# Patient Record
Sex: Female | Born: 1945 | Race: White | Hispanic: No
Health system: Southern US, Community
[De-identification: ages and names within clinical notes are randomized; demographics above are authoritative.]

---

## 2005-04-06 ENCOUNTER — Ambulatory Visit: Payer: Self-pay | Admitting: Family Medicine

## 2005-04-06 ENCOUNTER — Emergency Department: Payer: Self-pay | Admitting: Emergency Medicine

## 2005-04-24 ENCOUNTER — Ambulatory Visit: Payer: Self-pay

## 2005-05-10 ENCOUNTER — Ambulatory Visit: Payer: Self-pay | Admitting: Anesthesiology

## 2005-05-16 ENCOUNTER — Ambulatory Visit: Payer: Self-pay | Admitting: Anesthesiology

## 2005-05-30 ENCOUNTER — Ambulatory Visit: Payer: Self-pay | Admitting: Anesthesiology

## 2005-06-21 ENCOUNTER — Ambulatory Visit: Payer: Self-pay | Admitting: Anesthesiology

## 2005-07-13 ENCOUNTER — Ambulatory Visit: Payer: Self-pay | Admitting: Anesthesiology

## 2005-07-25 ENCOUNTER — Encounter: Payer: Self-pay | Admitting: Anesthesiology

## 2005-08-08 ENCOUNTER — Ambulatory Visit: Payer: Self-pay | Admitting: Anesthesiology

## 2005-08-15 ENCOUNTER — Encounter: Payer: Self-pay | Admitting: Anesthesiology

## 2005-08-29 ENCOUNTER — Inpatient Hospital Stay: Payer: Self-pay | Admitting: Internal Medicine

## 2005-09-05 ENCOUNTER — Ambulatory Visit: Payer: Self-pay | Admitting: Anesthesiology

## 2005-10-25 ENCOUNTER — Ambulatory Visit: Payer: Self-pay | Admitting: *Deleted

## 2005-11-02 ENCOUNTER — Ambulatory Visit: Payer: Self-pay | Admitting: Anesthesiology

## 2005-11-03 ENCOUNTER — Ambulatory Visit: Payer: Self-pay | Admitting: Specialist

## 2005-11-22 ENCOUNTER — Ambulatory Visit: Payer: Self-pay | Admitting: Internal Medicine

## 2005-11-24 ENCOUNTER — Ambulatory Visit: Payer: Self-pay | Admitting: Specialist

## 2005-12-21 ENCOUNTER — Inpatient Hospital Stay: Payer: Self-pay | Admitting: Unknown Physician Specialty

## 2006-01-23 ENCOUNTER — Ambulatory Visit: Payer: Self-pay | Admitting: Anesthesiology

## 2006-02-21 ENCOUNTER — Ambulatory Visit: Payer: Self-pay | Admitting: Anesthesiology

## 2006-02-27 ENCOUNTER — Encounter: Payer: Self-pay | Admitting: Unknown Physician Specialty

## 2006-03-17 ENCOUNTER — Encounter: Payer: Self-pay | Admitting: Unknown Physician Specialty

## 2006-03-22 ENCOUNTER — Ambulatory Visit: Payer: Self-pay | Admitting: Anesthesiology

## 2006-04-09 ENCOUNTER — Ambulatory Visit: Payer: Self-pay | Admitting: *Deleted

## 2006-04-13 ENCOUNTER — Ambulatory Visit: Payer: Self-pay | Admitting: *Deleted

## 2006-04-23 ENCOUNTER — Ambulatory Visit: Payer: Self-pay | Admitting: Anesthesiology

## 2006-05-15 ENCOUNTER — Other Ambulatory Visit: Payer: Self-pay

## 2006-05-16 ENCOUNTER — Inpatient Hospital Stay: Payer: Self-pay | Admitting: Internal Medicine

## 2006-05-21 ENCOUNTER — Ambulatory Visit: Payer: Self-pay | Admitting: Internal Medicine

## 2006-05-23 ENCOUNTER — Ambulatory Visit: Payer: Self-pay | Admitting: Anesthesiology

## 2006-06-20 ENCOUNTER — Ambulatory Visit: Payer: Self-pay | Admitting: Anesthesiology

## 2006-07-23 ENCOUNTER — Ambulatory Visit: Payer: Self-pay | Admitting: Anesthesiology

## 2006-08-20 ENCOUNTER — Ambulatory Visit: Payer: Self-pay | Admitting: Anesthesiology

## 2006-10-03 ENCOUNTER — Other Ambulatory Visit: Payer: Self-pay

## 2006-10-03 ENCOUNTER — Ambulatory Visit: Payer: Self-pay | Admitting: Unknown Physician Specialty

## 2006-10-11 ENCOUNTER — Inpatient Hospital Stay: Payer: Self-pay | Admitting: Unknown Physician Specialty

## 2006-10-13 ENCOUNTER — Other Ambulatory Visit: Payer: Self-pay

## 2007-02-18 ENCOUNTER — Ambulatory Visit: Payer: Self-pay | Admitting: Anesthesiology

## 2007-04-16 ENCOUNTER — Other Ambulatory Visit: Payer: Self-pay

## 2007-04-16 ENCOUNTER — Ambulatory Visit: Payer: Self-pay | Admitting: *Deleted

## 2007-04-16 ENCOUNTER — Ambulatory Visit: Payer: Self-pay | Admitting: Unknown Physician Specialty

## 2007-04-16 ENCOUNTER — Ambulatory Visit: Payer: Self-pay | Admitting: Cardiology

## 2007-04-24 ENCOUNTER — Inpatient Hospital Stay: Payer: Self-pay | Admitting: Unknown Physician Specialty

## 2007-05-17 ENCOUNTER — Encounter: Payer: Self-pay | Admitting: Unknown Physician Specialty

## 2007-05-19 ENCOUNTER — Encounter: Payer: Self-pay | Admitting: Unknown Physician Specialty

## 2007-06-10 ENCOUNTER — Other Ambulatory Visit: Payer: Self-pay

## 2007-06-11 ENCOUNTER — Inpatient Hospital Stay: Payer: Self-pay | Admitting: Internal Medicine

## 2007-06-16 ENCOUNTER — Encounter: Payer: Self-pay | Admitting: Unknown Physician Specialty

## 2007-07-18 ENCOUNTER — Ambulatory Visit: Payer: Self-pay | Admitting: Specialist

## 2008-01-15 ENCOUNTER — Ambulatory Visit: Payer: Self-pay | Admitting: Family Medicine

## 2008-01-27 ENCOUNTER — Ambulatory Visit: Payer: Self-pay | Admitting: Unknown Physician Specialty

## 2008-05-14 ENCOUNTER — Ambulatory Visit: Payer: Self-pay | Admitting: Family Medicine

## 2008-06-04 ENCOUNTER — Ambulatory Visit: Payer: Self-pay | Admitting: Gastroenterology

## 2009-06-23 ENCOUNTER — Ambulatory Visit: Payer: Self-pay | Admitting: Family Medicine

## 2009-08-16 ENCOUNTER — Ambulatory Visit: Payer: Self-pay | Admitting: Internal Medicine

## 2009-08-17 ENCOUNTER — Inpatient Hospital Stay: Payer: Self-pay | Admitting: Internal Medicine

## 2009-09-27 ENCOUNTER — Inpatient Hospital Stay: Payer: Self-pay | Admitting: Specialist

## 2009-11-07 ENCOUNTER — Inpatient Hospital Stay: Payer: Self-pay | Admitting: *Deleted

## 2009-11-19 ENCOUNTER — Observation Stay: Payer: Self-pay | Admitting: Specialist

## 2009-12-17 ENCOUNTER — Ambulatory Visit: Payer: Self-pay | Admitting: Family Medicine

## 2010-04-09 ENCOUNTER — Emergency Department: Payer: Self-pay | Admitting: Emergency Medicine

## 2010-05-19 ENCOUNTER — Emergency Department: Payer: Self-pay | Admitting: Emergency Medicine

## 2010-06-12 ENCOUNTER — Inpatient Hospital Stay: Payer: Self-pay | Admitting: *Deleted

## 2010-07-26 ENCOUNTER — Encounter: Payer: Self-pay | Admitting: Specialist

## 2010-09-13 ENCOUNTER — Ambulatory Visit: Payer: Self-pay | Admitting: Family Medicine

## 2010-10-31 ENCOUNTER — Inpatient Hospital Stay: Payer: Self-pay | Admitting: Internal Medicine

## 2010-12-04 ENCOUNTER — Inpatient Hospital Stay: Payer: Self-pay | Admitting: Internal Medicine

## 2011-03-10 ENCOUNTER — Inpatient Hospital Stay: Payer: Self-pay | Admitting: Internal Medicine

## 2011-03-17 ENCOUNTER — Ambulatory Visit: Payer: Self-pay | Admitting: Internal Medicine

## 2011-03-17 ENCOUNTER — Inpatient Hospital Stay: Payer: Self-pay | Admitting: Internal Medicine

## 2011-05-25 ENCOUNTER — Ambulatory Visit: Payer: Self-pay

## 2011-07-18 ENCOUNTER — Ambulatory Visit: Payer: Self-pay | Admitting: Gastroenterology

## 2011-09-14 ENCOUNTER — Ambulatory Visit: Payer: Self-pay | Admitting: Family Medicine

## 2011-10-12 ENCOUNTER — Other Ambulatory Visit: Payer: Self-pay | Admitting: Rehabilitation

## 2011-10-12 DIAGNOSIS — M549 Dorsalgia, unspecified: Secondary | ICD-10-CM

## 2011-10-20 ENCOUNTER — Ambulatory Visit
Admission: RE | Admit: 2011-10-20 | Discharge: 2011-10-20 | Disposition: A | Discharge status: Home / Self Care | Payer: Medicare Other | Source: Ambulatory Visit | Attending: Rehabilitation | Admitting: Rehabilitation

## 2011-10-20 VITALS — BP 116/59 | HR 80

## 2011-10-20 DIAGNOSIS — M549 Dorsalgia, unspecified: Secondary | ICD-10-CM

## 2011-10-20 MED ORDER — IOHEXOL 180 MG/ML  SOLN
15.0000 mL | Freq: Once | INTRAMUSCULAR | Status: AC | PRN
  Administered 2011-10-20: 15 mL via INTRATHECAL

## 2011-10-20 MED ORDER — DIAZEPAM 5 MG PO TABS
5.0000 mg | ORAL_TABLET | Freq: Once | ORAL | Status: AC
  Administered 2011-10-20: 5 mg via ORAL

## 2011-10-20 NOTE — Progress Notes (Signed)
States she has been off Sertraline for the past two days.  jkl

## 2012-04-23 ENCOUNTER — Observation Stay: Payer: Self-pay | Admitting: Family Medicine

## 2012-04-23 LAB — CBC
HCT: 43.5 % (ref 35.0–47.0)
MCH: 31.8 pg (ref 26.0–34.0)
MCHC: 33.2 g/dL (ref 32.0–36.0)
MCV: 96 fL (ref 80–100)
RBC: 4.55 10*6/uL (ref 3.80–5.20)
RDW: 15.2 % — ABNORMAL HIGH (ref 11.5–14.5)
WBC: 14 10*3/uL — ABNORMAL HIGH (ref 3.6–11.0)

## 2012-04-23 LAB — COMPREHENSIVE METABOLIC PANEL
Albumin: 3.7 g/dL (ref 3.4–5.0)
Alkaline Phosphatase: 121 U/L (ref 50–136)
Bilirubin,Total: 0.5 mg/dL (ref 0.2–1.0)
Calcium, Total: 8.8 mg/dL (ref 8.5–10.1)
Co2: 32 mmol/L (ref 21–32)
EGFR (African American): >60
Glucose: 135 mg/dL — ABNORMAL HIGH (ref 65–99)
SGPT (ALT): 20 U/L (ref 12–78)
Total Protein: 7.3 g/dL (ref 6.4–8.2)

## 2012-04-23 LAB — PRO B NATRIURETIC PEPTIDE: B-Type Natriuretic Peptide: 3465 pg/mL — ABNORMAL HIGH (ref 0–125)

## 2012-04-23 LAB — CK TOTAL AND CKMB (NOT AT ARMC)
CK, Total: 41 U/L (ref 21–215)
CK, Total: 48 U/L (ref 21–215)

## 2012-04-23 LAB — PROTIME-INR: Prothrombin Time: 13.6 secs (ref 11.5–14.7)

## 2012-04-23 LAB — APTT: Activated PTT: 29.7 secs (ref 23.6–35.9)

## 2012-04-24 LAB — CBC WITH DIFFERENTIAL/PLATELET
Basophil #: 0 10*3/uL (ref 0.0–0.1)
Eosinophil %: 0 %
HCT: 40.6 % (ref 35.0–47.0)
HGB: 14.6 g/dL (ref 12.0–16.0)
Lymphocyte %: 5.8 %
MCHC: 35.9 g/dL (ref 32.0–36.0)
Platelet: 184 10*3/uL (ref 150–440)
RBC: 4.3 10*6/uL (ref 3.80–5.20)
RDW: 14.9 % — ABNORMAL HIGH (ref 11.5–14.5)
WBC: 8.9 10*3/uL (ref 3.6–11.0)

## 2012-04-24 LAB — TROPONIN I: Troponin-I: <0.02 ng/mL

## 2012-04-24 LAB — BASIC METABOLIC PANEL
Anion Gap: 5 — ABNORMAL LOW (ref 7–16)
BUN: 17 mg/dL (ref 7–18)
Co2: 32 mmol/L (ref 21–32)
Creatinine: 0.58 mg/dL — ABNORMAL LOW (ref 0.60–1.30)
EGFR (African American): >60
Glucose: 125 mg/dL — ABNORMAL HIGH (ref 65–99)
Potassium: 4.3 mmol/L (ref 3.5–5.1)
Sodium: 140 mmol/L (ref 136–145)

## 2012-04-24 LAB — CK TOTAL AND CKMB (NOT AT ARMC)
CK, Total: 35 U/L (ref 21–215)
CK-MB: 0.9 ng/mL (ref 0.5–3.6)

## 2012-04-29 LAB — CULTURE, BLOOD (SINGLE)

## 2012-05-09 ENCOUNTER — Ambulatory Visit: Payer: Self-pay | Admitting: Family Medicine

## 2012-06-12 ENCOUNTER — Ambulatory Visit: Payer: Self-pay | Admitting: Specialist

## 2012-07-16 ENCOUNTER — Ambulatory Visit: Payer: Self-pay | Admitting: Internal Medicine

## 2012-07-21 LAB — CBC
HCT: 45.3 % (ref 35.0–47.0)
HGB: 14.8 g/dL (ref 12.0–16.0)
MCH: 31 pg (ref 26.0–34.0)
MCHC: 32.7 g/dL (ref 32.0–36.0)
MCV: 95 fL (ref 80–100)
RBC: 4.78 10*6/uL (ref 3.80–5.20)

## 2012-07-21 LAB — URINALYSIS, COMPLETE
Bacteria: NONE SEEN
Bilirubin,UR: NEGATIVE
Blood: NEGATIVE
Glucose,UR: NEGATIVE mg/dL (ref 0–75)
Leukocyte Esterase: NEGATIVE
Nitrite: NEGATIVE
Protein: 100
Squamous Epithelial: 1

## 2012-07-21 LAB — DRUG SCREEN, URINE
Amphetamines, Ur Screen: NEGATIVE (ref ?–1000)
Barbiturates, Ur Screen: NEGATIVE (ref ?–200)
Cannabinoid 50 Ng, Ur ~~LOC~~: NEGATIVE (ref ?–50)
Methadone, Ur Screen: NEGATIVE (ref ?–300)
Phencyclidine (PCP) Ur S: NEGATIVE (ref ?–25)
Tricyclic, Ur Screen: NEGATIVE (ref ?–1000)

## 2012-07-21 LAB — COMPREHENSIVE METABOLIC PANEL
Albumin: 3.7 g/dL (ref 3.4–5.0)
Anion Gap: 2 — ABNORMAL LOW (ref 7–16)
Calcium, Total: 8.7 mg/dL (ref 8.5–10.1)
Chloride: 104 mmol/L (ref 98–107)
Co2: 35 mmol/L — ABNORMAL HIGH (ref 21–32)
EGFR (African American): 60 — ABNORMAL LOW
EGFR (Non-African Amer.): 51 — ABNORMAL LOW
Osmolality: 287 (ref 275–301)
Potassium: 5.1 mmol/L (ref 3.5–5.1)
SGOT(AST): 18 U/L (ref 15–37)
SGPT (ALT): 19 U/L (ref 12–78)
Sodium: 141 mmol/L (ref 136–145)
Total Protein: 7.3 g/dL (ref 6.4–8.2)

## 2012-07-21 LAB — TROPONIN I: Troponin-I: <0.02 ng/mL

## 2012-07-22 ENCOUNTER — Inpatient Hospital Stay: Payer: Self-pay | Admitting: Internal Medicine

## 2012-07-22 LAB — URINALYSIS, COMPLETE
Blood: NEGATIVE
Glucose,UR: NEGATIVE mg/dL (ref 0–75)
Ketone: NEGATIVE
Ph: 5 (ref 4.5–8.0)
Protein: 30
RBC,UR: 6 /HPF (ref 0–5)
Specific Gravity: 1.043 (ref 1.003–1.030)
Squamous Epithelial: NONE SEEN
WBC UR: 1 /HPF (ref 0–5)

## 2012-07-23 LAB — TSH: Thyroid Stimulating Horm: 0.677 u[IU]/mL

## 2012-07-23 LAB — MAGNESIUM: Magnesium: 2 mg/dL

## 2012-07-24 LAB — BASIC METABOLIC PANEL
Anion Gap: 5 — ABNORMAL LOW (ref 7–16)
BUN: 39 mg/dL — ABNORMAL HIGH (ref 7–18)
Calcium, Total: 8.9 mg/dL (ref 8.5–10.1)
Co2: 31 mmol/L (ref 21–32)
Creatinine: 0.72 mg/dL (ref 0.60–1.30)
EGFR (African American): >60
Glucose: 151 mg/dL — ABNORMAL HIGH (ref 65–99)
Osmolality: 301 (ref 275–301)
Potassium: 4.5 mmol/L (ref 3.5–5.1)
Sodium: 145 mmol/L (ref 136–145)

## 2012-07-26 LAB — PLATELET COUNT: Platelet: 181 10*3/uL (ref 150–440)

## 2012-08-15 ENCOUNTER — Ambulatory Visit: Payer: Self-pay | Admitting: Internal Medicine

## 2012-08-21 ENCOUNTER — Ambulatory Visit: Payer: Self-pay | Admitting: Pain Medicine

## 2012-08-27 ENCOUNTER — Other Ambulatory Visit: Payer: Self-pay | Admitting: Pain Medicine

## 2012-08-27 LAB — BASIC METABOLIC PANEL
Anion Gap: 6 — ABNORMAL LOW (ref 7–16)
Chloride: 106 mmol/L (ref 98–107)
Co2: 28 mmol/L (ref 21–32)
Potassium: 3.9 mmol/L (ref 3.5–5.1)
Sodium: 140 mmol/L (ref 136–145)

## 2012-08-27 LAB — MAGNESIUM: Magnesium: 1.3 mg/dL — ABNORMAL LOW

## 2012-09-11 ENCOUNTER — Observation Stay: Payer: Self-pay | Admitting: Internal Medicine

## 2012-09-12 LAB — CREATININE, SERUM
Creatinine: 0.54 mg/dL — ABNORMAL LOW (ref 0.60–1.30)
EGFR (African American): >60

## 2012-10-31 ENCOUNTER — Ambulatory Visit: Payer: Self-pay | Admitting: Family Medicine

## 2012-12-17 ENCOUNTER — Ambulatory Visit: Payer: Self-pay | Admitting: Family Medicine

## 2013-02-05 ENCOUNTER — Ambulatory Visit: Payer: Self-pay | Admitting: Pain Medicine

## 2013-02-13 ENCOUNTER — Ambulatory Visit: Payer: Self-pay | Admitting: Pain Medicine

## 2013-03-05 ENCOUNTER — Ambulatory Visit: Payer: Self-pay | Admitting: Pain Medicine

## 2013-03-18 ENCOUNTER — Ambulatory Visit: Payer: Self-pay | Admitting: Pain Medicine

## 2013-04-02 ENCOUNTER — Ambulatory Visit: Payer: Self-pay | Admitting: Pain Medicine

## 2013-05-13 ENCOUNTER — Ambulatory Visit: Payer: Self-pay | Admitting: Pain Medicine

## 2013-05-26 ENCOUNTER — Ambulatory Visit: Payer: Self-pay | Admitting: Pain Medicine

## 2013-06-24 ENCOUNTER — Ambulatory Visit: Payer: Self-pay | Admitting: Pain Medicine

## 2013-07-16 ENCOUNTER — Ambulatory Visit: Payer: Self-pay | Admitting: Pain Medicine

## 2013-10-29 ENCOUNTER — Ambulatory Visit: Payer: Self-pay | Admitting: Pain Medicine

## 2013-11-05 ENCOUNTER — Ambulatory Visit: Payer: Self-pay | Admitting: Pain Medicine

## 2013-12-05 ENCOUNTER — Ambulatory Visit: Payer: Self-pay | Admitting: Internal Medicine

## 2013-12-10 ENCOUNTER — Ambulatory Visit: Payer: Self-pay | Admitting: Pain Medicine

## 2013-12-16 ENCOUNTER — Ambulatory Visit: Payer: Self-pay | Admitting: Internal Medicine

## 2014-01-07 ENCOUNTER — Inpatient Hospital Stay: Payer: Self-pay | Admitting: Internal Medicine

## 2014-01-07 LAB — CBC
HCT: 40 % (ref 35.0–47.0)
HGB: 12.8 g/dL (ref 12.0–16.0)
MCH: 30.5 pg (ref 26.0–34.0)
MCHC: 32 g/dL (ref 32.0–36.0)
MCV: 96 fL (ref 80–100)
Platelet: 189 10*3/uL (ref 150–440)
RBC: 4.19 10*6/uL (ref 3.80–5.20)
RDW: 18.7 % — ABNORMAL HIGH (ref 11.5–14.5)
WBC: 10.6 10*3/uL (ref 3.6–11.0)

## 2014-01-07 LAB — THEOPHYLLINE LEVEL: THEOPHYLLINE: 2.6 ug/mL — AB (ref 10.0–20.0)

## 2014-01-07 LAB — BASIC METABOLIC PANEL
Anion Gap: 4 — ABNORMAL LOW (ref 7–16)
BUN: 14 mg/dL (ref 7–18)
CHLORIDE: 100 mmol/L (ref 98–107)
CREATININE: 0.7 mg/dL (ref 0.60–1.30)
Calcium, Total: 9.1 mg/dL (ref 8.5–10.1)
Co2: 34 mmol/L — ABNORMAL HIGH (ref 21–32)
EGFR (African American): >60
EGFR (Non-African Amer.): >60
GLUCOSE: 154 mg/dL — AB (ref 65–99)
OSMOLALITY: 279 (ref 275–301)
Potassium: 3.5 mmol/L (ref 3.5–5.1)
SODIUM: 138 mmol/L (ref 136–145)

## 2014-01-07 LAB — TROPONIN I

## 2014-01-08 LAB — CBC WITH DIFFERENTIAL/PLATELET
BASOS ABS: 0 10*3/uL (ref 0.0–0.1)
Basophil %: 0.2 %
Eosinophil #: 0 10*3/uL (ref 0.0–0.7)
Eosinophil %: 0 %
HCT: 39.4 % (ref 35.0–47.0)
HGB: 13.1 g/dL (ref 12.0–16.0)
LYMPHS PCT: 3.3 %
Lymphocyte #: 0.2 10*3/uL — ABNORMAL LOW (ref 1.0–3.6)
MCH: 31.8 pg (ref 26.0–34.0)
MCHC: 33.3 g/dL (ref 32.0–36.0)
MCV: 96 fL (ref 80–100)
Monocyte #: 0.1 x10 3/mm — ABNORMAL LOW (ref 0.2–0.9)
Monocyte %: 1.8 %
Neutrophil #: 6.8 10*3/uL — ABNORMAL HIGH (ref 1.4–6.5)
Neutrophil %: 94.7 %
Platelet: 179 10*3/uL (ref 150–440)
RBC: 4.12 10*6/uL (ref 3.80–5.20)
RDW: 19 % — AB (ref 11.5–14.5)
WBC: 7.2 10*3/uL (ref 3.6–11.0)

## 2014-01-08 LAB — BASIC METABOLIC PANEL
ANION GAP: 4 — AB (ref 7–16)
BUN: 22 mg/dL — AB (ref 7–18)
CALCIUM: 8.5 mg/dL (ref 8.5–10.1)
CHLORIDE: 103 mmol/L (ref 98–107)
Co2: 33 mmol/L — ABNORMAL HIGH (ref 21–32)
Creatinine: 0.65 mg/dL (ref 0.60–1.30)
EGFR (African American): >60
EGFR (Non-African Amer.): >60
Glucose: 153 mg/dL — ABNORMAL HIGH (ref 65–99)
Osmolality: 286 (ref 275–301)
Potassium: 4 mmol/L (ref 3.5–5.1)
Sodium: 140 mmol/L (ref 136–145)

## 2014-01-10 LAB — BASIC METABOLIC PANEL
ANION GAP: 4 — AB (ref 7–16)
BUN: 31 mg/dL — ABNORMAL HIGH (ref 7–18)
CALCIUM: 9.2 mg/dL (ref 8.5–10.1)
Chloride: 99 mmol/L (ref 98–107)
Co2: 34 mmol/L — ABNORMAL HIGH (ref 21–32)
Creatinine: 0.78 mg/dL (ref 0.60–1.30)
EGFR (Non-African Amer.): >60
GLUCOSE: 115 mg/dL — AB (ref 65–99)
Osmolality: 281 (ref 275–301)
Potassium: 4.8 mmol/L (ref 3.5–5.1)
SODIUM: 137 mmol/L (ref 136–145)

## 2014-01-11 LAB — CBC WITH DIFFERENTIAL/PLATELET
Basophil #: 0 10*3/uL (ref 0.0–0.1)
Basophil %: 0.1 %
EOS ABS: 0 10*3/uL (ref 0.0–0.7)
Eosinophil %: 0 %
HCT: 39.9 % (ref 35.0–47.0)
HGB: 13.2 g/dL (ref 12.0–16.0)
LYMPHS PCT: 2.3 %
Lymphocyte #: 0.3 10*3/uL — ABNORMAL LOW (ref 1.0–3.6)
MCH: 31 pg (ref 26.0–34.0)
MCHC: 33.1 g/dL (ref 32.0–36.0)
MCV: 94 fL (ref 80–100)
MONOS PCT: 3.7 %
Monocyte #: 0.4 x10 3/mm (ref 0.2–0.9)
NEUTROS PCT: 93.9 %
Neutrophil #: 11.2 10*3/uL — ABNORMAL HIGH (ref 1.4–6.5)
Platelet: 228 10*3/uL (ref 150–440)
RBC: 4.27 10*6/uL (ref 3.80–5.20)
RDW: 18 % — AB (ref 11.5–14.5)
WBC: 12 10*3/uL — ABNORMAL HIGH (ref 3.6–11.0)

## 2014-01-12 LAB — EXPECTORATED SPUTUM ASSESSMENT W REFEX TO RESP CULTURE

## 2014-01-15 ENCOUNTER — Ambulatory Visit: Payer: Self-pay | Admitting: Internal Medicine

## 2014-02-06 ENCOUNTER — Encounter: Payer: Self-pay | Admitting: Surgery

## 2014-02-12 ENCOUNTER — Ambulatory Visit: Payer: Self-pay | Admitting: Orthopedic Surgery

## 2014-02-12 LAB — CBC WITH DIFFERENTIAL/PLATELET
Basophil #: 0.1 10*3/uL (ref 0.0–0.1)
Basophil %: 0.4 %
EOS ABS: 0 10*3/uL (ref 0.0–0.7)
Eosinophil %: 0 %
HCT: 39.9 % (ref 35.0–47.0)
HGB: 12.9 g/dL (ref 12.0–16.0)
LYMPHS PCT: 5.4 %
Lymphocyte #: 0.8 10*3/uL — ABNORMAL LOW (ref 1.0–3.6)
MCH: 31.2 pg (ref 26.0–34.0)
MCHC: 32.4 g/dL (ref 32.0–36.0)
MCV: 96 fL (ref 80–100)
MONOS PCT: 3.9 %
Monocyte #: 0.5 x10 3/mm (ref 0.2–0.9)
NEUTROS ABS: 12.8 10*3/uL — AB (ref 1.4–6.5)
Neutrophil %: 90.3 %
PLATELETS: 220 10*3/uL (ref 150–440)
RBC: 4.15 10*6/uL (ref 3.80–5.20)
RDW: 16.6 % — ABNORMAL HIGH (ref 11.5–14.5)
WBC: 14.2 10*3/uL — ABNORMAL HIGH (ref 3.6–11.0)

## 2014-02-12 LAB — BASIC METABOLIC PANEL
Anion Gap: 8 (ref 7–16)
BUN: 17 mg/dL (ref 7–18)
CALCIUM: 9.2 mg/dL (ref 8.5–10.1)
Chloride: 97 mmol/L — ABNORMAL LOW (ref 98–107)
Co2: 34 mmol/L — ABNORMAL HIGH (ref 21–32)
Creatinine: 0.71 mg/dL (ref 0.60–1.30)
EGFR (Non-African Amer.): >60
GLUCOSE: 150 mg/dL — AB (ref 65–99)
Osmolality: 282 (ref 275–301)
POTASSIUM: 4.8 mmol/L (ref 3.5–5.1)
Sodium: 139 mmol/L (ref 136–145)

## 2014-02-26 ENCOUNTER — Ambulatory Visit: Payer: Self-pay | Admitting: Orthopedic Surgery

## 2014-02-26 LAB — HEMOGLOBIN: HGB: 11.1 g/dL — ABNORMAL LOW (ref 12.0–16.0)

## 2014-03-02 LAB — WOUND CULTURE

## 2014-03-05 ENCOUNTER — Emergency Department: Payer: Self-pay | Admitting: Emergency Medicine

## 2014-03-05 LAB — COMPREHENSIVE METABOLIC PANEL
ALBUMIN: 3.3 g/dL — AB (ref 3.4–5.0)
ANION GAP: 8 (ref 7–16)
Alkaline Phosphatase: 86 U/L
BUN: 13 mg/dL (ref 7–18)
Bilirubin,Total: 0.5 mg/dL (ref 0.2–1.0)
CALCIUM: 8.5 mg/dL (ref 8.5–10.1)
CHLORIDE: 99 mmol/L (ref 98–107)
CREATININE: 0.86 mg/dL (ref 0.60–1.30)
Co2: 32 mmol/L (ref 21–32)
EGFR (African American): >60
EGFR (Non-African Amer.): >60
Glucose: 123 mg/dL — ABNORMAL HIGH (ref 65–99)
OSMOLALITY: 279 (ref 275–301)
Potassium: 3.4 mmol/L — ABNORMAL LOW (ref 3.5–5.1)
SGOT(AST): 16 U/L (ref 15–37)
SGPT (ALT): 21 U/L
Sodium: 139 mmol/L (ref 136–145)
TOTAL PROTEIN: 6.6 g/dL (ref 6.4–8.2)

## 2014-03-05 LAB — CBC
HCT: 31.1 % — AB (ref 35.0–47.0)
HGB: 10.1 g/dL — ABNORMAL LOW (ref 12.0–16.0)
MCH: 30.9 pg (ref 26.0–34.0)
MCHC: 32.6 g/dL (ref 32.0–36.0)
MCV: 95 fL (ref 80–100)
PLATELETS: 329 10*3/uL (ref 150–440)
RBC: 3.28 10*6/uL — ABNORMAL LOW (ref 3.80–5.20)
RDW: 15.8 % — ABNORMAL HIGH (ref 11.5–14.5)
WBC: 14 10*3/uL — AB (ref 3.6–11.0)

## 2014-03-05 LAB — THEOPHYLLINE LEVEL: Theophylline: 9.9 ug/mL — ABNORMAL LOW (ref 10.0–20.0)

## 2014-03-05 LAB — TROPONIN I

## 2014-03-16 ENCOUNTER — Inpatient Hospital Stay: Payer: Self-pay | Admitting: Internal Medicine

## 2014-03-16 LAB — URINALYSIS, COMPLETE
BLOOD: NEGATIVE
Bilirubin,UR: NEGATIVE
Glucose,UR: NEGATIVE mg/dL (ref 0–75)
Nitrite: POSITIVE
PROTEIN: NEGATIVE
Ph: 6 (ref 4.5–8.0)
RBC,UR: 2 /HPF (ref 0–5)
SPECIFIC GRAVITY: 1.018 (ref 1.003–1.030)
SQUAMOUS EPITHELIAL: NONE SEEN
WBC UR: 40 /HPF (ref 0–5)

## 2014-03-16 LAB — CBC
HCT: 34.8 % — ABNORMAL LOW (ref 35.0–47.0)
HGB: 11.2 g/dL — ABNORMAL LOW (ref 12.0–16.0)
MCH: 28.9 pg (ref 26.0–34.0)
MCHC: 32.3 g/dL (ref 32.0–36.0)
MCV: 89 fL (ref 80–100)
PLATELETS: 349 10*3/uL (ref 150–440)
RBC: 3.89 10*6/uL (ref 3.80–5.20)
RDW: 16.7 % — ABNORMAL HIGH (ref 11.5–14.5)
WBC: 12.2 10*3/uL — ABNORMAL HIGH (ref 3.6–11.0)

## 2014-03-16 LAB — BASIC METABOLIC PANEL
ANION GAP: 7 (ref 7–16)
BUN: 20 mg/dL — ABNORMAL HIGH (ref 7–18)
Calcium, Total: 8.7 mg/dL (ref 8.5–10.1)
Chloride: 92 mmol/L — ABNORMAL LOW (ref 98–107)
Co2: 35 mmol/L — ABNORMAL HIGH (ref 21–32)
Creatinine: 0.6 mg/dL (ref 0.60–1.30)
EGFR (African American): >60
EGFR (Non-African Amer.): >60
Glucose: 82 mg/dL (ref 65–99)
Osmolality: 270 (ref 275–301)
Potassium: 3.9 mmol/L (ref 3.5–5.1)
Sodium: 134 mmol/L — ABNORMAL LOW (ref 136–145)

## 2014-03-16 LAB — TROPONIN I

## 2014-03-17 ENCOUNTER — Ambulatory Visit: Payer: Self-pay | Admitting: Internal Medicine

## 2014-03-17 LAB — BASIC METABOLIC PANEL
Anion Gap: 9 (ref 7–16)
BUN: 25 mg/dL — AB (ref 7–18)
CALCIUM: 8.6 mg/dL (ref 8.5–10.1)
CO2: 34 mmol/L — AB (ref 21–32)
CREATININE: 0.79 mg/dL (ref 0.60–1.30)
Chloride: 94 mmol/L — ABNORMAL LOW (ref 98–107)
Glucose: 135 mg/dL — ABNORMAL HIGH (ref 65–99)
Osmolality: 280 (ref 275–301)
Potassium: 3.9 mmol/L (ref 3.5–5.1)
Sodium: 137 mmol/L (ref 136–145)

## 2014-03-17 LAB — CBC WITH DIFFERENTIAL/PLATELET
BASOS PCT: 0.1 %
Basophil #: 0 10*3/uL (ref 0.0–0.1)
EOS PCT: 0 %
Eosinophil #: 0 10*3/uL (ref 0.0–0.7)
HCT: 33.6 % — AB (ref 35.0–47.0)
HGB: 10.8 g/dL — ABNORMAL LOW (ref 12.0–16.0)
LYMPHS PCT: 4.6 %
Lymphocyte #: 0.3 10*3/uL — ABNORMAL LOW (ref 1.0–3.6)
MCH: 28.7 pg (ref 26.0–34.0)
MCHC: 32.2 g/dL (ref 32.0–36.0)
MCV: 89 fL (ref 80–100)
MONOS PCT: 2.2 %
Monocyte #: 0.1 x10 3/mm — ABNORMAL LOW (ref 0.2–0.9)
NEUTROS ABS: 5.7 10*3/uL (ref 1.4–6.5)
NEUTROS PCT: 93.1 %
PLATELETS: 351 10*3/uL (ref 150–440)
RBC: 3.78 10*6/uL — ABNORMAL LOW (ref 3.80–5.20)
RDW: 17 % — ABNORMAL HIGH (ref 11.5–14.5)
WBC: 6.1 10*3/uL (ref 3.6–11.0)

## 2014-03-18 LAB — URINALYSIS, COMPLETE
BLOOD: NEGATIVE
Bilirubin,UR: NEGATIVE
GLUCOSE, UR: NEGATIVE mg/dL (ref 0–75)
Hyaline Cast: 6
Ketone: NEGATIVE
NITRITE: POSITIVE
PH: 6 (ref 4.5–8.0)
PROTEIN: NEGATIVE
SPECIFIC GRAVITY: 1.013 (ref 1.003–1.030)
Squamous Epithelial: 1
WBC UR: 5 /HPF (ref 0–5)

## 2014-03-21 LAB — CULTURE, BLOOD (SINGLE)

## 2014-04-17 ENCOUNTER — Ambulatory Visit: Payer: Self-pay | Admitting: Internal Medicine

## 2014-06-16 ENCOUNTER — Ambulatory Visit
Admit: 2014-06-16 | Disposition: A | Discharge status: Home / Self Care | Payer: Self-pay | Attending: Internal Medicine | Admitting: Internal Medicine

## 2014-06-19 ENCOUNTER — Emergency Department: Payer: Self-pay | Admitting: Emergency Medicine

## 2014-07-17 ENCOUNTER — Ambulatory Visit
Admit: 2014-07-17 | Disposition: A | Discharge status: Home / Self Care | Payer: Self-pay | Attending: Internal Medicine | Admitting: Internal Medicine

## 2014-07-17 DEATH — deceased

## 2014-08-07 NOTE — H&P (Signed)
PATIENT NAME:  Sydney Guerrero, Sydney Guerrero MR#:  [id number] DATE OF BIRTH:  [birth year]  DATE OF ADMISSION:  07/22/2012  PRIMARY CARE PHYSICIAN: Dr. Rolm GalaHeidi Grandis.   REFERRING PHYSICIAN: Chiquita LothJade Sung.   CHIEF COMPLAINT: Altered mental status. The patient is encephalopathic.   HISTORY OF THE PRESENT ILLNESS: The patient is a 69 year old Caucasian female with history of chronic obstructive pulmonary disease and chronic respiratory failure home oxygen dependent and history of obstructive sleep apnea. EMS was called by the neighbor for finding the patient slumped over in the chair, and she was responding. Upon arrival of EMS, the patient received 2 doses of Narcan, after which the patient became awake and responsive. Right now, the patient is back again in deep sleep. The patient had recent spinal cord stimulator implanted, and she is on hydrocodone for pain control. The patient had a prior admission to this hospital with a similar episode of altered mental status associated with acute respiratory failure secondary to CO2 narcosis and retention. The patient is not providing me any history. She will wake up upon stimulation but only for a few seconds. Does not respond to my questions. She just stares in my eyes with halfway open eyes. Then, she will be back to sleep.   REVIEW OF SYSTEMS: A 10-point system review is attempted but could not get any meaningful response from the patient.   PAST MEDICAL HISTORY: Chronic respiratory failure home oxygen dependent, chronic obstructive pulmonary disease, obstructive sleep apnea, noncompliant with CPAP treatment, systemic hypertension, hyperlipidemia, type 2 diabetes mellitus, past history of atrial fibrillation, chronic back pain, status post spinal cord stimulator implant, gastroesophageal reflux disease and history of cardiac pacemaker insertion.   PAST SURGICAL HISTORY: Back surgery and recent history of spinal cord stimulator implant. History of hysterectomy, cholecystectomy  and hip replacement.   SOCIAL HABITS: Ex-chronic smoker according to the records. No history of alcohol abuse.   SOCIAL HISTORY: She lives at home by herself. I could not get more information from her.   FAMILY HISTORY: Unobtainable due to the patient's obtundation. Her old records mention there is a family history of cancer but no specification.   ADMISSION MEDICATIONS: Aspirin 325 mg a day, Ventolin inhaler p.r.n., zolpidem 5 mg a day, gabapentin 600 mg 3 times a day, metformin 1000 mg twice a day, diltiazem 300 mg once a day, theophylline 200 mg twice a day, lorazepam 0.5 mg q.6 hours p.r.n. for anxiety, sertraline 50 mg 2 tablets once a day, omeprazole 20 mg twice a day, metoprolol succinate 50 mg twice a day, atorvastatin 40 mg a day, cyclobenzaprine 5 mg once a day, prednisone 10 mg a day, furosemide 20 mg a day. She is on oxygen. The liters were not specified.    ALLERGIES: No known drug allergies.   PHYSICAL EXAMINATION:  VITAL SIGNS: Blood pressure 118/60. Respiratory rate 24. Respiratory rate now is down to 16. Pulse 70, temperature 99.2. O2 saturation 97% while she is on 65% of BiPAP with oxygen supplementation.  GENERAL APPEARANCE: This is an elderly female lying in bed on BiPAP, sleeping, in no acute distress.  HEAD AND NECK: No pallor. No icterus. No cyanosis. Ear examination: Could not assess hearing due to patient drowsiness and decreased responsiveness. No visible ulcers or discharge. Examination of the nose showed no discharge, no bleeding. Oropharyngeal examination limited exam due to patient being drowsy and not following commands. She has BiPAP face mask. The visible part of the mouth shows no ulcers, no exudate, no oral thrush. Eye  examination revealed normal eyelids. Normal conjunctivae. Pupils are constricted. Neck is supple. Trachea at midline. No masses.  HEART: Normal S1, S2. No S3, S4. No murmur. No gallop. No carotid bruits.  RESPIRATORY: Normal breathing pattern without  use of accessory muscles. No rales. No wheezing. The patient is receiving now BiPAP. Lungs are clear.   ABDOMEN: Soft without tenderness. No hepatosplenomegaly. No masses. No hernias.  SKIN: No ulcers. No subcutaneous nodules.  MUSCULOSKELETAL: No joint swelling. No clubbing.  NEUROLOGIC: Cranial nerves II through XII were intact. No focal motor deficit. The patient is very lethargic and sleepy but arousable just for a few seconds and then she will be back to sleep. She is moving her extremities.  PSYCHIATRIC: Unobtainable due to patient's severe drowsiness and sleepiness.   LABORATORY FINDINGS AND RADIOLOGIC DATA: Chest x-ray showed no acute cardiopulmonary abnormality; however, there was cardiomegaly. CTA of the chest showed no evidence of pulmonary embolism. Incidental finding of mild asymmetry or fullness of the right lobe of the thyroid. EKG showed electronic pacing rhythm at rate of 72 per minute. Glucose 136, BUN 22, creatinine 1.1, sodium 141, potassium 5.1. Anion gap was 2. Normal liver function tests and liver transaminases. Total CPK 18. Troponin less than 0.02. Urine drug screen was positive for opiates. CBC showed white count of 12,000, hemoglobin 14, hematocrit 45, platelet count 275. Urinalysis was unremarkable except for 21 red blood cells, 1 white blood cell. No bacteria. ABG showed a pH of 7.23, pCO2 was 74, pO2 was 47 and bicarbonate 31.   ASSESSMENT:  1. Altered mental status secondary to metabolic encephalopathy and CO2 narcosis.  2. Acute on chronic respiratory failure, probably precipitated by narcotic usage.  3. Chronic obstructive pulmonary disease, on oxygen.  4. Obstructive sleep apnea, noncompliant with CPAP treatment.  5. Diabetes mellitus, type 2.  6. Systemic hypertension.  7. Chronic back pain.  8. Status post spinal cord stimulator implant.  9. Status post cardiac pacemaker insertion.  10. Gastroesophageal reflux disease  11. Osteoarthritis.  12. Depression.    PLAN: The patient will be admitted to the intensive care unit. She is right now on BiPAP treatment. Will reduce the oxygen FiO2 from 65% down to minimize CO2 narcosis. Will use Narcan p.r.n. if needed. I will keep the patient n.p.o. since she is very drowsy, very sleepy and barely able to open her eyes just for a brief period of time and it is unsafe to give her medication or to feed her. Gentle IV hydration. DuoNebs q.4 hours as needed. Small dose of IV Solu-Medrol. For deep vein thrombosis prophylaxis, will place her on subcutaneous heparin 5000 units twice a day. Will monitor her response.   The patient's code status according to the old records is FULL CODE.  Critical time management spent was more than 50 minutes. Time spent in evaluating this patient took more than 1 hour.    ____________________________ Carney Corners. Rudene Re, MD amd:gb D: 07/22/2012 01:05:59 ET T: 07/22/2012 04:10:14 ET JOB#: [id number]  cc: Carney Corners. Rudene Re, MD, <Dictator> Zollie Scale MD ELECTRONICALLY SIGNED 07/31/2012 5:06

## 2014-08-07 NOTE — Discharge Summary (Signed)
PATIENT NAME:  Sydney Guerrero, [name] MR#:  [id number] DATE OF BIRTH:  1945/12/17  DATE OF ADMISSION:  09/11/2012 DATE OF DISCHARGE:  09/13/2012  PRIMARY CARE PROVIDER: Rolm GalaHeidi Grandis, MD  DISCHARGE DIAGNOSES: 1.  Left femur fracture.  2.  Chronic respiratory failure with chronic obstructive pulmonary disease, on 3 liters oxygen.   CONSULTANTS: Murlean HarkShalini Ramasunder, MD  IMAGING STUDIES: Done included CT of the head which showed left femur fracture.   ADMITTING HISTORY AND PHYSICAL AND HOSPITAL COURSE: Please see detailed H and P dictated by Dr. Rudene Rearwish on 09/11/2012. In brief, this is a 69 year old Caucasian female patient with history of COPD, on 3 liters oxygen at home, who presented to the hospital after having a mechanical fall, having left hip pain. The patient had a left femur intertrochanteric fracture, which was not thought to be amenable to surgery. The case was discussed with Dr. Claris Gladdenamasunder who says to discharge her home and follow up with her in the clinic, but the patient could not ambulate in the Emergency Room with significant pain, was admitted to the hospitalist service for placement in a rehab. The patient worked with PT, was seen by Dr. Claris Gladdenamasunder.  The patient is doing well, pain is well controlled at this time, continues to be on 3 liters oxygen and will be discharged to skilled nursing facility. She does have decreased range of motion on the left hip with tenderness on movement on exam.   DISCHARGE MEDICATIONS: Include: 1.  Aspirin 325 mg oral once a day.  2.  Ventolin HFA 2 puffs inhaled 2 times a day.  3.  Gabapentin 600 mg oral 2 times a day.  4.  Metformin 1000 mg oral once a day.  5.  Diltiazem 300 mg oral once a day. 6.  Theophylline 200 mg oral 2 times a day. 7.  Sertraline 50 mg 2 tablets oral once a day.  8.  Omeprazole 20 mg oral 2 times a day.  9.  Metoprolol succinate 50 mg oral 2 times a day.  10.  Atorvastatin 40 mg oral once a day at bedtime.  11.  Prednisone 10  mg oral once a day. 12.  Lasix 20 mg oral once a day.  13.  Nitrostat 0.4 mg sublingual as needed for chest pain.  14.  Brovana 15 mcg/mL 2 mL inhaled 2 times a day.  15.  Tramadol 50 mg oral as needed.  16.  Acetaminophen/hydrocodone 325/5 one tablet oral every 6 hours as needed for pain.  17.  Enoxaparin 40 mg subcutaneous once a day for 14 days.  18.  Lorazepam 1 mg oral once a day as needed.  19.  Ambien 5 mg oral once a day at bedtime.  20.  Magnesium oxide 400 mg oral once a day.   DISCHARGE INSTRUCTIONS: The patient will be on oxygen 3 liters through nasal cannula to keep sats between 90 to 92%. Low-sodium diet. Activity as tolerated using assistance with physical therapy. Follow up with Dr. Claris Gladdenamasunder of orthopedics in 1 to 2 weeks.   TIME SPENT: On day of discharge in discharge activity was 40 minutes. ____________________________ Molinda BailiffSrikar R. Britain Anagnos, MD srs:sb D: 09/13/2012 16:40:37 ET T: 09/13/2012 16:49:37 ET JOB#: [id number]  cc: Wardell HeathSrikar R. Elpidio AnisSudini, MD, <Dictator> Letitia CaulHeidi M. Grandis, MD Orie FishermanSRIKAR R Jazsmin Couse MD ELECTRONICALLY SIGNED 10/08/2012 10:42

## 2014-08-07 NOTE — Consult Note (Signed)
Brief Consult Note: Diagnosis: Left proximal femur periprosthetic fracture, non-displaced.   Consult note dictated.   Comments: Larey SeatFell while stepping backwards over elevated stone.  Went to stand up and fell again.  Came to ED and was found to have non-displaced fracture about total hip arthroplasty stem. Appears well secured without evidence of loosening on CT.  Wanted to go hom from ED but unable to use walker for TTWB.   PE: NVI. Bruising about thigh. Calf NT.  Preserved quad tone.  A/P: May be TTWB if able to maintain.  If not, NWB.  May require short stay in rehab/SNF.  Okay to do ROM of knee, ankle, hip - gentle.  Follow up in office in 2 weeks for repeat radiographs.  No intervention needed on this hospitalization.  Electronic Signatures: Murlean Harkamasunder, Kieara Schwark (MD)  (Signed 29-May-14 13:34)  Authored: Brief Consult Note   Last Updated: 29-May-14 13:34 by Murlean Harkamasunder, Dyllan Hughett (MD)

## 2014-08-07 NOTE — Consult Note (Signed)
   Comments   Pt's sister arrived. Chaplain present to complete HCPOA paperwork. Sister agrees with DNR and asks that a MOST form be completed as well. Will complete for patient to sign.  15 minutes  Electronic Signatures: Borders, Daryl EasternJoshua R (NP)  (Signed 11-Apr-14 14:38)  Authored: Palliative Care Phifer, Harriett SineNancy (MD)  (Signed 11-Apr-14 21:00)  Authored: Palliative Care   Last Updated: 11-Apr-14 21:00 by Phifer, Harriett SineNancy (MD)

## 2014-08-07 NOTE — H&P (Signed)
PATIENT NAME:  Sydney Guerrero, Sydney Guerrero MR#:  [id number] DATE OF BIRTH:  1945-05-20  DATE OF ADMISSION:  04/23/2012  PRIMARY CARE PHYSICIAN: Letitia Caul, MD   CHIEF COMPLAINT: Altered mental status and hypercapnia.   HISTORY OF PRESENT ILLNESS: The patient is a 69 year old female with a history of COPD, who had back surgery yesterday at [facility], who presented with shortness of breath and altered mental status, was found to be hypercapnic with a CO2 of 87. She was placed on BiPAP in the ER. She continues to remain on BiPAP machine with some confusion. She was treated with Levaquin and steroids as well as nebulizers. Hospitalist service was asked to evaluate the patient for admission. The patient is a poor historian at this time due to her altered mental status.   REVIEW OF SYSTEMS: Unable to obtain as the patient is altered and does not answer questions appropriately.   PAST MEDICAL HISTORY: According to the medical chart:  1. History of COPD, on  oxygen.  2. Hypertension.  3. Hyperlipidemia.  4. Chronic respiratory failure.  5. Diabetes.  6. GERD.  7. Chronic pain syndrome.   ALLERGIES: No known drug allergies.   SOCIAL HISTORY: Former smoker. No alcohol or drug use. She lives by herself.    FAMILY HISTORY: Per the medical chart, cancer.   MEDICATIONS:  1. Oxycodone/acetaminophen 10/325 q. 4 hours p.r.n. pain for 5 days. This prescription was given yesterday, 04/22/2012, after back surgery.  2. Levaquin 500 mg daily, start on 04/22/2012.  3. Lasix 20 mg daily.  4. Ambien 5 mg h.s. p.r.n.  5. Gabapentin 600 mg t.i.d.  6. Metformin 1000 mg daily.  7. Diltiazem 300 mg daily.  8. Lasix 20 mg daily.  9. Theophylline 200 mg q. 12 hours.  10. Ativan 1 mg q. 6 hours p.r.n.  11. Zoloft 50 mg, 2 tablets daily.  12. Omeprazole 20 mg b.i.d.  13. Metoprolol 50 mg b.i.d.   14. Atorvastatin 40 mg daily.  15. Cyclobenzaprine 5 mg h.s.   16. Prednisone 10 mg daily.   17. Ventolin HFA 90  mcg, 2 puffs by mouth b.i.d.   PAST SURGICAL HISTORY: 1. Back surgery yesterday. 2. Bilateral knee replacement.  3. Joint replacement.  4. Left hip replacement.  5. Hysterectomy.  6. Cholecystectomy.  7. Pacemaker.  PHYSICAL EXAMINATION: VITAL SIGNS: Temperature 99.4, pulse 70, respirations 24, blood pressure 158/81, 95% on BiPAP.  GENERAL: The patient is alert to her name and place, not time.  HEENT: Head is atraumatic. Pupils are round and reactive. Sclerae are anicteric. The patient has a BiPAP. I did not examine her oropharynx.  NECK: Short. Hard to appreciate any kind of enlarged thyroid or JVD.  LUNGS: She has got some diffuse wheezing without any crackles, rales.  CARDIOVASCULAR: Regular rate and rhythm. No murmurs, gallops, or rubs. PMI is difficult to assess due to body habitus.  ABDOMEN: Bowel sounds are positive. Nontender. Hard to appreciate organomegaly due to body habitus.  EXTREMITIES: No clubbing, cyanosis, or edema.  NEUROLOGIC: Hard to do a neurologic exam as the patient is not very cooperative. She does not have any obvious focal deficits.  MUSCULOSKELETAL: The patient does move all extremities.  SKIN: She has got some bruising on her lower extremities. No other rashes or lesions are found.   LABORATORY, DIAGNOSTIC AND RADIOLOGICAL DATA:  PCO2 of 82. BNP 3465. CK 41, CPK-MB 0.9. Troponin is less than 0.02. INR 1.0. Sodium 139, potassium 4.4, chloride 102, bicarbonate 32,  BUN 16, creatinine 0.74, glucose 135, calcium 8.8, bilirubin 0.5, alkaline phosphatase 121, ALT 20, AST 17, total protein 7.3, albumin 3.7. White blood cells 14, hemoglobin 14.5, hematocrit 44, platelets are 214.   Chest x-ray shows no acute cardiopulmonary disease. EKG shows a paced rhythm.   ASSESSMENT AND PLAN: A 69 year old female who had back surgery yesterday, was given narcotics, presents with shortness of breath, hypercapnia and wheezing with acute on chronic respiratory failure.   1. Acute  on chronic respiratory failure: Likely secondary to hypercapnia with COPD exacerbation. The patient is on a BiPAP. We will continue BiPAP for now. Repeat the ABG later this afternoon. Continue per respiratory therapy.  2. Acute chronic obstructive pulmonary disease exacerbation: We will continue with nebulizers, steroids, and Levaquin. Continue BiPAP for now as mentioned.  3. Hypercapnia: Likely secondary to COPD exacerbation as well narcotics. The patient has been receiving pain medications after her surgery. She may have taken too many, which probably contributed to some of her hypercapnia. We will monitor closely.  4. Diabetes: For now, the patient is n.p.o. due to her BiPAP. We will place her on sliding scale insulin, hold metformin. ADA diet when able to tolerate a p.o.   CODE STATUS: The patient is a FULL CODE, according to the past medical record.   TIME SPENT: Approximately 45 minutes.  ____________________________ Janyth ContesSital P. Juliene PinaMody, MD spm:cb D: 04/23/2012 14:23:19 ET T: 04/23/2012 15:12:06 ET JOB#: [id number] cc: Aribella Vavra P. Juliene PinaMody, MD, <Dictator> Letitia CaulHeidi M. Grandis, MD Janyth ContesSITAL P Laylah Riga MD ELECTRONICALLY SIGNED 04/23/2012 16:41

## 2014-08-07 NOTE — Consult Note (Signed)
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   Patient was seen by consultant.   Consult note dictated.   Comments: Psychiatry: Patient seen. Consult due to possible dementia. On exam tonight she is alert and interactive and pleasant. She has no complaints. In fact she is clearly trying to appear as good as possible. She said"I know everybody's name and I never misplace anything." On MMSI she gets an 18/30. Could be worse but she does have some dementia. I don't know how this compares to how she was before but I'm sure that sitting around hypoxic with your O2 in the 70s for a while will shave off some IQ points.   I'm not sure whether this is about capacity or treatment of dementia or what. I think currently she does have capacity based on our discussion about her treatment and homelife. I'd agree with prior assessments that she should be on no opiates or benzos at home if possible. Will follow. Let me know if I'm missing the point.  Electronic Signatures: Audery Amellapacs, Cesilia Shinn T (MD)  (Signed 09-Apr-14 20:45)  Authored: Brief Consult Note   Last Updated: 09-Apr-14 20:45 by Audery Amellapacs, Jailin Manocchio T (MD)

## 2014-08-07 NOTE — Discharge Summary (Signed)
PATIENT NAME:  Sydney Guerrero, Sydney Guerrero MR#:  [id number] DATE OF BIRTH:  [birth year]  DATE OF ADMISSION:  04/23/2012 DATE OF DISCHARGE:  04/24/2012  REASON FOR ADMISSION: Altered mental status and hypercapnia.   DISCHARGE DIAGNOSES: 1.  Altered mental status due to metabolic encephalopathy, CO2 narcosis.  2.  Nonadherence to CPAP.  3.  Acute on chronic respiratory failure.  4.  Chronic obstructive pulmonary disease exacerbation.  5.  Diabetes type 2, controlled.  6.  Previous history of atrial fibrillation.  7.  Osteoarthritis.  8.  Depression.  9.  Obstructive sleep apnea.  10.  Chronic back pain.  11.  Status post back surgery at [facility].  12.  Gastroesophageal reflux disease.  13.  Status post permanent pacemaker.   DISPOSITION: Home.   MEDICATIONS AT DISCHARGE:  Please discontinue Percocet.   MEDICATIONS GIVEN:   1.  Aspirin 325 mg daily.  2.  Ventolin 90 mcg inhaler 2 times a day 2 puffs.  3.  Levofloxacin 500 mg once daily for 10 days. This is a medication given at discharge at [facility] due to COPD exacerbation and her back surgery.  4.  Zolpidem 5 mg once daily.  5.  Gabapentin 600 mg 3 times a day.  6.  Metformin 1000 mg once daily.  7.  Diltiazem 300 mg once daily.  8.  Theophylline 200 mg every 12 hours.  9.  Lorazepam 1 mg take 0.5 tablets every 6 hours p.r.n. anxiety.  10.  Sertraline 50 mg, take 2 tablets once daily.  11.  Omeprazole 20 mg twice daily.  12.  Metoprolol succinate 50 mg twice daily.  13.  Atorvastatin 40 mg once daily.  14.  Cyclobenzaprine 5 mg once daily.  15.  Prednisone 10 mg once daily. This is chronic treatment.  16.  Furosemide 20 mg once daily.   HOSPITAL COURSE: The patient is a very nice 69 year old female who was admitted after having a procedure done at [facility] the previous day due to back pain. On the day of surgery, she was discharged and she was given some antibiotics for a COPD exacerbation at the moment. The patient came with  increased shortness of breath and altered mental status. She was found to be hypercapnic with a CO2 of 87% and she was placed on BiPAP. The patient actually improved significantly after BiPAP. There was a decision to continue her home steroids and Levaquin. She actually was given a higher dose of steroids due to a possible COPD exacerbation but at discharge she was sent home on the 10 mg of steroids that she takes on a regular basis. Her Levaquin was continued. Likely this exacerbation of her acute on chronic respiratory failure was due to hypercapnia which was triggered of the patient not using her BiPAP. The patient also was taking pain medications that might contribute to this.  We recommended the patient to stop her pain medications and to adhere to the BiPAP. She states that her mask did not fit for what we recommended to do a followup with her primary care physician for referral to a pulmonary doctor for a new sleep study and refitting of the mask.   PRIMARY CARE PHYSICIAN: Heidi Grandis.  TIME SPENT: I spent about 40 minutes with this discharge. The patient was evaluated by PT.  She did not have any significant needs other than maybe a new walker. She is discharged in good condition.      ____________________________ Felipa Furnace, MD rsg:cs D:  04/25/2012 16:03:00 ET T: 04/25/2012 21:06:15 ET JOB#: [id number]  cc: Felipa Furnaceoberto Sanchez Gutierrez, MD, <Dictator> Letitia CaulHeidi M. Grandis, MD Pearletha FurlOBERTO SANCHEZ GUTIERRE MD ELECTRONICALLY SIGNED 04/26/2012 13:46

## 2014-08-07 NOTE — H&P (Signed)
PATIENT NAME:  Sydney Guerrero, Sydney Guerrero MR#:  [id number] DATE OF BIRTH:  01-Mar-1946  DATE OF ADMISSION:  09/10/2012  PRIMARY CARE PHYSICIAN: Dr. Rolm Gala.   REFERRING PHYSICIAN: Governor Rooks, MD.  CHIEF COMPLAINT: Left groin pain, status post fall.   HISTORY OF PRESENT ILLNESS: Sydney Guerrero is a 69 year old Caucasian female with a history of chronic obstructive pulmonary disease, obstructive sleep apnea syndrome on chronic respiratory failure. The patient was in her usual state of health until last evening when she was walking. She tripped around a square brick, resulting in a fall and sustaining pain in the left groin area. The patient was transported to the Emergency Department for evaluation, and here she was found to have a left femur intertrochanteric fracture. The patient has hardware on the left hip area. The patient denies having any injury elsewhere, and she did not have any head trauma. The only pain she has is left groin area. She also denies having any focal weakness.   REVIEW OF SYSTEMS:  CONSTITUTIONAL: Denies having any fever. No chills. No fatigue.  EYES: No blurring of vision. No double vision.  ENT: No hearing impairment. No sore throat. No dysphagia.  CARDIOVASCULAR: No chest pain. No shortness of breath. No syncope.  RESPIRATORY: No cough. No sputum production. No hemoptysis.  GASTROINTESTINAL: No abdominal pain. No vomiting. No diarrhea.  GENITOURINARY: No dysuria. No frequency of urination.  MUSCULOSKELETAL: Apart from the left groin pain she has no other joint pain or swelling. No muscular pain or swelling.  INTEGUMENTARY: No skin rash. No ulcers.  NEUROLOGY: No focal weakness. No seizure activity. No headache.  PSYCHIATRY: No anxiety. No depression.  ENDOCRINE: No polyuria or polydipsia. No heat or cold intolerance.   PAST MEDICAL HISTORY: Chronic obstructive pulmonary disease, chronic respiratory failure, home oxygen-dependent on 3 liters, obstructive sleep apnea  syndrome; noncompliant with CPAP treatment; she does not use it. Systemic hypertension, hyperlipidemia, type 2 diabetes mellitus, history of atrial fibrillation, chronic back pain, status post spinal cord stimulator implant, and history of cardiac pacemaker insertion and history of gastroesophageal reflux disease.   PAST SURGICAL HISTORY: Hysterectomy, cholecystectomy, left hip replacement, spinal cord stimulator implant, and history of back surgery.   SOCIAL HABITS: Ex-chronic smoker. No alcohol or drug abuse.   FAMILY HISTORY: Her father died from cancer. She does not know the type. Her mother is still living. She suffers from diabetes mellitus and she is, right now, 69 years old.   ADMISSION MEDICATIONS: Ventilation inhaler 2 puffs 4 times a day p.r.n., tramadol 50 mg 4 times a day p.r.n., theophylline 200 mg twice a day, Zoloft 100 mg once a day, prednisone  10 mg a day, omeprazole 20 mg twice a day, Nitrostat 0.4 mg p.r.n., metoprolol succinate 50 mg twice a day, metformin 1000 mg once a day, gabapentin 600 mg 3 times a day, furosemide  20 mg a day, diltiazem 300 mg once a day, atorvastatin 40 mg once a day, aspirin 325 mg a day.   ALLERGIES: No known drug allergies.   PHYSICAL EXAMINATION: VITAL SIGNS: Blood pressure 126/66, respiratory rate 20, pulse 70, temperature 98.4, oxygen saturation 95% while patient is on oxygen.  GENERAL APPEARANCE: Elderly female laying in bed in no acute distress. She looks comfortable.  HEAD AND NECK EXAMINATION: No pallor. No icterus. No cyanosis. Ears, nose, throat: Ear examination revealed normal hearing. No discharge. No lesions. Examination of the nose showed no bleeding, no ulcers. Oropharyngeal examination revealed normal lips and tongue. No oral thrush,  no exudates. Eye examination revealed normal eyelids and conjunctivae. Pupils are about 4 to 5 mm, round, equal and reactive to light.  NECK: Supple. Trachea is midline. No thyromegaly. No cervical  lymphadenopathy. No masses.  HEART EXAM: Revealed distant heart sounds. No murmur was appreciated. Difficult to auscultate. No S3 or S4. No gallop. No carotid bruits.  RESPIRATORY EXAM: Showed normal breathing pattern without use of accessory muscles. No rales. There are a few mild scattered expiratory wheezes, heard occasionally. Good air entry.  ABDOMEN: Soft, without tenderness. No hepatosplenomegaly. No masses. No hernias.  SKIN EXAMINATION: Revealed no ulcers. No subcutaneous nodules.  MUSCULOSKELETAL: No joint swelling. No clubbing. She is limiting movement of the left leg due to pain in the groin area. However, she is able to lift the leg above the level bed level. NEUROLOGIC EXAM: Cranial nerves II-XII are intact. No focal motor deficit.  PSYCHIATRIC EVALUATION: The patient is alert and oriented x 3. Mood and affect were normal.   CT scan of the left hip showed a limited study due to presence of artifact from the left hip hardware. There was a nondisplaced intratrochanteric proximal left femur fracture without destruction.   Plain x-ray of the left hip showed no acute abnormality.   Blood workup was not done. The last time she had a blood workup was earlier this month on May 13th, showed a glucose of 147, BUN 11, creatinine 0.7, sodium 140, potassium 3.9.   ASSESSMENT: 1.  Left femur intertrochanteric fracture, status post fall.  2.  Chronic respiratory failure, home oxygen-dependent on 3 liters.  3.  Chronic obstructive pulmonary disease.  4.  Obstructive sleep apnea syndrome; noncompliant with CPAP.  5.  Systemic hypertension.  6.  Type 2 diabetes mellitus.  7.  Hyperlipidemia.  8.  History of atrial fibrillation.  9.  Chronic back pain, status post spinal cord stimulator implant.  10.  History of cardiac pacemaker insertion.  11.  History of gastroesophageal reflux disease.  12.  History of hysterectomy, cholecystectomy and left hip replacement.   PLAN: The patient will be  admitted for observation. Orthopedic consultation was obtained. They  feel that this is a nonsurgical approach and recommended non-weightbearing using either a walker or crutches. Physical therapy consult. Pain control, but to be cautious with the pain medications since this patient has a prior history of narcotic abuse resulting in respiratory decompensation and altered mental status. Continue home medications as listed above.   Code is status is FULL CODE.   Time spent in evaluating this patient took more than 1-1/2 hours due to the patient's non-cooperation. She has changed her mind several times and at one point she refused to be admitted. She wants to go home. However, she could not help herself to walk independently, then she changed her mind. I canceled the admission orders, then I readmitted the patient again. She also refuses to go to a placement and rehabilitation. Hopefully will involve the social services.      ____________________________ Carney CornersAmir M. Rudene Rearwish, MD amd:dm D: 09/11/2012 07:44:29 ET T: 09/11/2012 08:41:12 ET JOB#: [id number]  cc: Carney CornersAmir M. Rudene Rearwish, MD, <Dictator> Zollie ScaleAMIR M Tyrie Porzio MD ELECTRONICALLY SIGNED 09/12/2012 6:46

## 2014-08-07 NOTE — Consult Note (Signed)
PATIENT NAME:  Sydney Guerrero, [name] MR#:  [id number] DATE OF BIRTH:  Aug 05, 1945  DATE OF CONSULTATION:  09/13/2012  REFERRING PHYSICIAN:   CONSULTING PHYSICIAN:  Murlean HarkShalini Luise Yamamoto, MD  The patient is a 69 year old female who [facility]. She has had a total knee arthroplasty as well as a total hip arthroplasty on the left lower extremity by Dr. Gerrit Heckaliff. She had tripped backward at home while letting her dog out. She had fallen and attempted to stand and then fell again. She presented to the Emergency Room. She was found to have a nondisplaced periprosthetic fracture of the left total hip arthroplasty femoral stem. She was admitted for pain control and possible placement in a skilled nursing facility. The patient complains of pain in the thigh.   PHYSICAL EXAMINATION:  The left lower extremity was examined. The patient has obvious bruising about the thigh. She has moderate tenderness to palpation about the thigh. She has preserved quadriceps tone and is able to do quadriceps set. She has is neurovascularly intact distally. She has no groin pain on log roll.   IMAGING STUDIES:  Radiographs of the left hip as well as a CT scan of the left hip demonstrate a spiral periprosthetic fracture about the total hip arthroplasty femoral stem. The fractures completely nondisplaced. CT scan demonstrates that the femoral stem appears well fixed.   ASSESSMENT:  Periprosthetic fracture about left total hip arthroplasty with preservation of implant stability.   PLAN:  The patient will be toe-touch weightbearing. She will be allowed to work on gentle range of motion of the knee and hip. She will follow up in the office in 2 weeks with Dr. Kennedy BuckerMichael Menz. She is aware that we will begin weight bearing when she demonstrates radiographic signs of healing or near complete resolution of pain. I did discuss with her that all her prosthesis appears well fixed at this time, certainly with any displacement of the  fractures, her prosthesis can loosen. At this time she will require no surgical intervention.  ____________________________ Murlean HarkShalini Sajan Cheatwood, MD sr:jm D: 09/13/2012 16:31:41 ET T: 09/13/2012 17:38:40 ET JOB#: [id number]  cc: Murlean HarkShalini Quantrell Splitt, MD, <Dictator> Murlean HarkSHALINI Anny Sayler MD ELECTRONICALLY SIGNED 09/16/2012 14:17

## 2014-08-07 NOTE — Consult Note (Signed)
PATIENT NAME:  Sydney Guerrero, Sydney Guerrero MR#:  [id number] DATE OF BIRTH:  08-Aug-1945  DATE OF CONSULTATION:  07/24/2012  REFERRING PHYSICIAN:   CONSULTING PHYSICIAN:  Audery Amel, MD  IDENTIFYING INFORMATION AND REASON FOR CONSULTATION:  A 69 year old woman with a history of chronic obstructive pulmonary disease.  Consult for dementia, cognitive decline.   HISTORY OF PRESENT ILLNESS:  Information obtained from the patient and the chart.  The patient was brought in to the hospital this time having been found hypoxic and unresponsive at home.  She responded to Narcan briefly, but then became unresponsive again.  She had to be taken care of in the CCU briefly, but has now been able to move out to the floor.  On interview today I asked the patient what her explanation was for coming into the hospital and she says she thinks she must have overdosed on her pain medicine.  She did not intend to do this intentionally, however.  The patient denies any depression.  Denies hopelessness.  Denies any suicidal or homicidal ideation.  Says her mood feels fine.  She denies to me being aware of any recent change in her cognitive function.  I ask her if she ever notices having more difficulty remembering people's names or in finding things.  She absolutely denied it and said that she knows everyone's name and never loses anything.  She was clearly making an effort to appear as good as possible in discussion with me.  I think she understood that seeing a psychiatrist meant that there was a question about her cognitive ability and she assumed that this meant that someone was going to make her stop living independently.  The patient this morning had been described as being more slow and mentally down than might be expected.  I am not sure whether her current presentation when I am seeing her is subjectively different than her baseline or not.  I did a Mini-Mental status exam with her and she scored an 18.  Mild to moderately demented  most likely.   PAST PSYCHIATRIC HISTORY:  It does not appear that this patient has any significant past psychiatric history.  Denies any past history of depression.  No history of psychiatric hospitalization.  Does not seem to have been an issue despite her multiple medical problems.   PAST MEDICAL HISTORY:  Long history of multiple medical problems including COPD, history of GI bleed in the past, history of recent encephalopathy.  Type 2 diabetes, chronic back pain, gastroesophageal reflux.   MEDICATIONS ON ADMISSION:  Medication list when she came in were aspirin 325 mg a day, Ventolin inhaler as needed, Zolpidem 5 mg a day, gabapentin 600 mg 3 times a day, metformin 1000 mg twice a day, diltiazem 300 mg once a day, theophylline 200 mg twice a day, Ativan 0.5 mg q. 6 hours as needed, sertraline 50 mg 2 tablets once a day, omeprazole 20 mg twice a day, metoprolol 50 mg twice a day, atorvastatin 40 mg a day, cyclobenzaprine 5 mg once a day, prednisone 10 mg a day, furosemide 20 mg a day.  I can also state that having looked her up online she has been getting oxycodone 15 mg three of them a day.   MENTAL STATUS EXAMINATION:  Elderly woman, looks her stated age, neatly groomed, cooperative with the interview.  Good eye contact.  Psychomotor activity appeared to be normal.  Speech was normal in rate and tone.  Affect slightly flustered at times, but not  sad or depressed.  Appropriately reactive.  Mood stated as being fine.  Thoughts appeared to be superficially lucid, although she clearly has difficulty remembering details and loses her train of thought fairly easily.  No sign of delusional thinking.  Denies hallucinations.  Denied suicidal or homicidal ideation.  On Mini-Mental status exam she scores an 18 out of 30.  She clearly notices that she has had some mistakes on the Mini-Mental status and becomes more anxious after that.  I had to reassure her quite a bit.  Judgment and insight seemed fairly  reasonable.  She understood that over-usage of narcotic pain medicine was potentially deadly.  She understood that she had to be careful taking care of her medicine at home and keep them organized well.   SOCIAL HISTORY:  She tells me that she lives alone.  She has extensive family including a mother who is still living.  She says she gets frequent visitors at home.  The patient tells me she still drives.   ASSESSMENT:  The patient is a 69 year old woman who certainly currently shows signs of dementia.  I have no previous baseline to compare this to.  To superficial conversation she does not appear to be slowed or obviously impaired, although I think she does have memory impairment and probably executive impairment.  She does not appear to have an acute depression.  I do not think there is any question about suicidal behavior.  The patient has multiple reasons for developing dementia.  At this age she could be already having Alzheimer's disease, but in addition, she probably has elements of vascular dementia with her medical problems and having had more than one episode of becoming severely hypoxic including this episode in which her oxygen saturations were down into the 70s for who knows how long almost certainly would result in some brain injury.   TREATMENT PLAN:  I do not have any specific treatment to recommend.  Her primary care doctor can consider the possibility of adding Aricept or other medicines for Alzheimer's disease slowing.  I agree with the previous physician who said that this patient should not be on any narcotic pain medicines or any benzodiazepines at home.  I think she is probably too high a danger that she is going to continue to accidentally overdose on them with predictable results.  I think it would be a good idea for someone to talk to the family and ensure that they think that it is safe for her at home, perhaps have a home health nurse check in and make sure that she is managing her  medication correctly.   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   SECONDARY DIAGNOSES: AXIS I:  No further.  AXIS II:  No diagnosis.  AXIS III:  Multiple medical problems as detailed above.  AXIS IV:  Moderate stress from living alone.  AXIS V:  Functioning at time of evaluation 40.     ____________________________ Audery AmelJohn T. Jaydin Boniface, MD jtc:ea D: 07/24/2012 23:39:43 ET T: 07/25/2012 06:21:19 ET JOB#: [id number]  cc: Audery AmelJohn T. Bexton Haak, MD, <Dictator> Audery AmelJOHN T Mervyn Pflaum MD ELECTRONICALLY SIGNED 07/28/2012 11:27

## 2014-08-07 NOTE — Discharge Summary (Signed)
PATIENT NAME:  Sydney Guerrero, Sydney Guerrero MR#:  [id number] DATE OF BIRTH:  06/03/1945  DATE OF ADMISSION:  07/22/2012 DATE OF DISCHARGE:  07/26/2012  PRESENTING COMPLAINT: Altered mental status, lethargic.   DISCHARGE DIAGNOSES: 1.  Acute on chronic hypoxic hypercarbic respiratory failure in the setting of pain medication and benzodiazepine use.  2.  Chronic respiratory failure, on home oxygen.  3.  Endstage chronic obstructive pulmonary disease.  4.  Hypertension.  5.  Type 2 diabetes.   CONDITION ON DISCHARGE: Fair.   CODE STATUS: NO CODE, DNR.   MEDICATIONS:  1.  Aspirin 325 mg p.o. daily.  2.  Ventolin HFA 90 mcg inhalation two puffs b.i.d.  3.  Gabapentin 600 mg 3 times a day.  4.  Metformin 1000 mg daily.  5.  Diltiazem 300 mg extended release daily.  6.  Theophylline 200 mg orally every 12 hours.  7.  Sertraline 50 mg two tablets daily.  8.  Omeprazole 20 mg b.i.d.  9.  Metoprolol 50 mg extended release b.i.d.  10. Atorvastatin 40 mg at bedtime.  11. Prednisone 10 mg daily.  12. Lasix 20 mg daily.  13. Nitroglycerin 0.4 mg sublingual as needed.  14. Cefuroxime 500 mg one tablet b.i.d.  15. Azithromycin 250 mg p.o. daily.   ACTIVITY: Home health physical therapy, along with RN has been set up.  HOSPITAL COURSE:  1.  The patient is a 69 year old Caucasian female with history of endstage COPD, on home oxygen, and chronic back pain, who has been prescribed narcotics through a pain clinic and benzodiazepine for a muscle relaxant, who comes in with altered mental status, which was suspected due to metabolic encephalopathy and CO2 narcosis in the setting of overuse of pain meds and benzodiazepines. She presented with altered mental status, found to have CO2 of 74, went up to 96, increased up to 108. She has had similar presentation in the past. She was placed in the Intensive Care Unit, started on BiPAP. Dr. Meredeth Guerrero saw the patient, slowly weaned off her BiPAP and put her on 3 liters nasal  cannula oxygen. Her CO2 came back to normal. Her mentation improved. She has been tapered with steroids. She was continued on her inhalers and nebs. She will follow up with Dr. Meredeth Guerrero as an outpatient.  2.  Pneumonia. P.o. Zithromax and cefuroxime were prescribed.  3.  COPD, on home oxygen. P.o. steroids, inhalers and nebulizers were continued.  4.  Obstructive sleep apnea. The patient has been somewhat noncompliant with her CPAP; however, she was using while she was in-house.  5.  Type 2 diabetes. Resumed her home meds prior to discharge.  6.  Systemic hypertension. Home meds again were resumed, which was diltiazem and metoprolol.  7.  Chronic back pain. The patient has a spinal cord stimulator. She was recommended not to take narcotics, and she did agree to it.  8.  Physical therapy saw the patient and recommended home PT.   PLAN: Discharge plan was discussed with the patient's sister, Sydney Guerrero. The patient is a NO CODE. This was discussed with palliative care and the patient's sister, who was present during palliative care meeting. She will be followed up by health nurses as well. Hospital stay otherwise remained stable. The patient remained NO CODE, DNR.   TIME SPENT: Forty minutes.   ____________________________ Sydney HailSona A. Allena KatzPatel, MD sap:lg D: 07/26/2012 17:18:18 ET T: 07/27/2012 13:11:19 ET JOB#: [id number]  cc: Sydney Brett A. Allena KatzPatel, MD, <Dictator> Sydney E. Sydney IdeFleming, MD Sydney Lotter A Tryone Kille  MD ELECTRONICALLY SIGNED 08/05/2012 7:01

## 2014-08-08 NOTE — H&P (Signed)
PATIENT NAME:  Sydney Guerrero, Sydney Guerrero MR#:  [id number] DATE OF BIRTH:  Dec 20, 1945  DATE OF ADMISSION:  01/07/2014  PRIMARY CARE PROVIDER: Dr. Leotis Shames.    REFERRING DOCTOR:  Dr. Janalyn Harder    CHIEF COMPLAINT: Lethargy, respiratory difficulties.   HISTORY OF PRESENT ILLNESS: The patient is a 69 year old white female with history of chronic COPD on chronic oxygen at home, who normally is on 3 liters of oxygen, but is told to increase her oxygen with exertion, who lives by herself, continues to smoke, who is brought into the ED by her sister due to the patient being lethargic and having worsening respiratory symptoms. According to her, her sister has had difficulty with breathing for the past 1 month. She has been having cough productive of greenish sputum. The patient was brought in very lethargic. Had to be placed on BiPAP. She is a DNR, therefore she was not intubated. Her chest x-ray was nonrevealing. The patient is noted to have hypercarbia. Otherwise review of systems unobtainable.   PAST MEDICAL HISTORY:  1. History of COPD requiring chronic oxygen therapy.  2. Obstructive sleep apnea unable to tolerate CPAP.  3. Hypertension.  4. Hyperlipidemia.  5. Type 2 diabetes.  6. History of atrial fibrillation.  7. Chronic back pain.  8. Status post spinal cord stimulator and implant.  9. History of cardiac pacemaker insertion.   10. GERD.   ALLERGIES: None.   CURRENT MEDICATIONS: She is on Voltaren topically 1% b.i.d., Ventolin 2 puffs every 6 hours as needed, trazodone 50 daily, Spiriva 2 puffs daily, theophylline 200 q. 12, prednisone 10 mg daily, nystatin applied to affected area as needed, Nitrostat 0.4 sublingual as needed, metoprolol succinate 50 mg 1 tab p.o. b.i.d., metformin 1000 half tab t.i.d., magnesium oxide 400 mg 1 tab p.o. b.i.d., lorazepam 0.5 mg q. 6 p.r.n., Lasix 40 mg 1 tab 4 times a day, Klor-Con 20 mEq 1 tab p.o. daily, gabapentin 300 mg 1 tab p.o. t.i.d., Dymista 137  mcg/50 mcg 2 sprays nasally b.i.d., diltiazem 300 mg 1 tab p.o. daily, Carafate 600 one tab p.o. daily, budesonide b.i.d., Brovana 15 mcg 2 mL inhalation b.i.d., betamethasone applied topically to affected area, aspirin 325 p.o. daily, albuterol q. 6 p.r.n., acetaminophen/oxycodone 325/5 q. 6 p.r.n.  SOCIAL HISTORY: Continues to smoke. No alcohol or drug use.   FAMILY HISTORY: Father died of cancer, they are not sure what type. Mother with diabetes.   REVIEW OF SYSTEMS:   Patient lethargic, unable to obtain review of systems.    PHYSICAL EXAMINATION:  VITAL SIGNS: Temperature 98.7, pulse 70 respirations 32, blood pressure 144/127, O2 was 86%.  GENERAL: The patient is a critically ill-appearing female. Opens her eyes with stimuli, but is not able to communicate much.  HEENT: Head atraumatic, normocephalic. Pupils equally round, reactive to light and accommodation. There is no conjunctival pallor. No scleral icterus. Nasal exam shows no drainage or ulceration. External ear exam shows no erythema or drainage.  NECK:  Supple, without any JVD. No thyromegaly.  CARDIOVASCULAR: Regular rate and rhythm. No murmurs, rubs, clicks, or gallops.  LUNGS: Diminished breath sounds bilaterally without any rales, rhonchi, or wheezing.  ABDOMEN: Soft, nontender, nondistended. Positive bowel sounds x 4. No hepatosplenomegaly.  EXTREMITIES: No clubbing, cyanosis, or edema.  SKIN: No rash.  LYMPHATIC: No lymph nodes palpable.  MUSCULOSKELETAL: There is no erythema or swelling. She has bilateral elbows with opening where the drainage is chronically.  NEUROLOGICAL: The patient is lethargic. Moving all 4 extremities  spontaneously.  PSYCHIATRIC: Not anxious or depressed.  LYMPH NODES: Nonpalpable.   LABORATORY DATA: Evaluations, PA and lateral chest x-ray shows underlying emphysematous changes. WBC 10.6, hemoglobin 10.8, platelet count 189,000. Glucose 154, BUN 14, creatinine 0.70, sodium 138, potassium 3.5, chloride  100, CO2 of 34. ABG with pH 7.37, pCO2 of 59, pO2 of 42. Chest x-ray shows underlying emphysematous changes.   ASSESSMENT AND PLAN: The patient is a 69 year old white female with chronic respiratory failure sleep apnea, diabetes, who continues to smoke, presents with acute respiratory failure.   1.  Acute on chronic respiratory failure due to acute on chronic obstructive pulmonary disease exacerbation with acute bronchitis, resulting in acute hypercarbic and hypoxic mixed respiratory failure. At this time we will treat her with nebulizers with albuterol and Atrovent q. 6 hours., IV Solu-Medrol, IV antibiotics with Levaquin and Mucinex and Spiriva.  2.  Diabetes. Continue metformin. I am going to place her on sliding scale insulin.   3.  Sleep apnea.  Encourage CPAP.  4.  History of bilateral bursa sac infection. Sister requests her ID MD to see, I will ask them to come by.  5.  Code. The patient is a DNR. Sister wishes to discuss with palliative care regarding further options, I will ask them to come by and see her  CRITICAL CARE TIME SPENT:  65 minutes on this patient.     ____________________________ Lacie ScottsShreyang H. Allena KatzPatel, MD shp:bu D: 01/07/2014 16:31:53 ET T: 01/07/2014 18:25:33 ET JOB#: [id number]  cc: Nasira Janusz H. Allena KatzPatel, MD, <Dictator> Charise CarwinSHREYANG H Shylynn Bruning MD ELECTRONICALLY SIGNED 01/16/2014 17:57

## 2014-08-08 NOTE — Op Note (Signed)
PATIENT NAME:  Sydney Guerrero, Sydney Guerrero MR#:  [id number] DATE OF BIRTH:  [birth year]  DATE OF PROCEDURE:  02/26/2014  PREOPERATIVE DIAGNOSIS: Left olecranon bursitis, chronic, open.   POSTOPERATIVE DIAGNOSIS: Left olecranon bursitis, chronic, open.   PROCEDURE: Excision bursa with closure of wound, irrigation and debridement elbow.   ANESTHESIA: Local with MAC.   SURGEON: Leitha SchullerMichael J. Rickey Farrier, MD    DESCRIPTION OF PROCEDURE: The patient was brought to the operating room, and after adequate anesthesia was obtained with sedation, local anesthetic was infiltrated after patient identification and timeout procedure completed. Initially with a 50/50 mixed 10 mL of 1% Xylocaine and 0.5% Sensorcaine. Additional combination of this was also infiltrated for a total of 20 mL during the surgery.   The prior open area approximately a centimeter in length was elliptically excised and deep tissue was debrided and a portion of the deep tissue off the bone was sent as a culture. After thorough debridement of the bursa, a relaxing incision was made more laterally and this open area was then thoroughly irrigated. Hemostasis was checked with electrocautery. The open incision was then repaired using 4-0 nylon in a simple interrupted and vertical mattress fashion with the more lateral incision irrigated and left open  to allow for drainage. The wound was then dressed with Xeroform over the incision and a wound VAC along with a  Webril, posterior splint and Ace wrap. The patient was sent to the recovery room in stable condition.   ESTIMATED BLOOD LOSS: 25 mL.   COMPLICATIONS: None.   TOURNIQUET:  Not used during the procedure.   SPECIMEN: Culture left elbow.     ____________________________ Leitha SchullerMichael J. Marquelle Musgrave, MD mjm:AT D: 02/26/2014 21:08:50 ET T: 02/26/2014 23:15:10 ET JOB#: [id number]  cc: Leitha SchullerMichael J. Moraima Burd, MD, <Dictator> Leitha SchullerMICHAEL J Kalan Yeley MD ELECTRONICALLY SIGNED 02/27/2014 0:15

## 2014-08-08 NOTE — H&P (Signed)
PATIENT NAME:  Sydney Guerrero, Sydney Guerrero MR#:  [id number] DATE OF BIRTH:  [birth year]  DATE OF ADMISSION:  03/16/2014  PRIMARY CARE PHYSICIAN:  Leotis Shames, MD   EMERGENCY ROOM PHYSICIAN: DR.Shavetiz   CHIEF COMPLAINT: Altered mental status and shortness of breath.   HISTORY OF PRESENT ILLNESS: The patient is a 69 year old female patient with chronic respiratory failure on 3 liters of oxygen, found unresponsive at home with the family. According to the family, she was confused and was found  on floor .  oxygen saturations were 70% on 2 liters at home. The patient brought in here because of unresponsiveness and hypoxia. The patient right now on 3 liters of oxygen saturating 98%, awake, and she denies any complaints. The patient  is >  followed with hospice and I have spoken with [facility] from hospice nurse. She told me that she was getting antibiotics for pneumonia and looks like she is on Levaquin . < now patient is  unable to give any history. She says he feels better and does not have any problems.   PAST MEDICAL HISTORY: Significant for COPD, history of hypertension, and diabetes, and recent admission significant for admission in September for COPD exacerbation. Other medical problems include COPD, requiring outpatient 3 liters all the time, history of hypertension, hyperlipidemia, diabetes, chronic atrial fibrillation, obstructive sleep apnea unable to tolerate CPAP, chronic back pain, history of spinal cord stimulator and implant placement, cardiac pacemaker insertion, GERD.   ALLERGIES: None.   SOCIAL HISTORY: Continues to smoke. No alcohol. No drugs.   FAMILY HISTORY: Father had cancer, he died of cancer.    REVIEW OF SYSTEMS:  Unobtainable because she is slightly confused.    MEDICATIONS: She is on Cardizem CD 300 mg daily, Lasix 40 mg p.o. q.i.d., Neurontin 300 mg q.i.d., prednisone 10 mg in the morning, omeprazole 20 mg p.o. b.i.d., Theo-Dur 200 mg p.o. b.i.d., Reglan 10 mg p.o. t.i.d. with  meals, and Lipitor 40 mg at bedtime, trazodone 50 mg at bedtime, albuterol nebulizers, and Spiriva, patient is on metoprolol succinate 50 mg p.o. b.i.d., Brovana 50 mcg inhalation b.i.d., metformin 1 gram 1/2 tablet t.i.d., Percocet tablet 5/325 one tablet every 6 hrs prn for >> pain.   PHYSICAL EXAMINATION: VITAL SIGNS: Temperature 98.3, heart rate 66, blood pressure 115/60, sat 97% on 3 mL.  GENERAL: She is slightly confused, well-developed, well-nourished female.  HEAD: Normocephalic, atraumatic.  EYES: Pupils equal, reacting to light. Extraocular movements intact.  EARS AND NOSE AND THROAT: No tympanic membrane congestion. No turbinate hypertrophy. No oropharyngeal erythema.  NECK: Supple. No JVD. No carotid bruit.  RESPIRATORY: Bilateral expiratory wheezing in all lung fields.  CARDIOVASCULAR: S1, S2 regular. No murmurs. PMI not displaced. No pedal edema.  GASTROINTESTINAL: Abdomen is soft, nontender, nondistended, bowel sounds present.  MUSCULOSKELETAL: Extremities move x 4, no tenderness or effusion.  SKIN: Inspection is normal.  LYMPHATICS: No lymphadenopathy.  NEUROLOGIC: Patient is slightly confused, but no focal neurological deficits. PSYCHIATRIC:  Patient is slightly confused.   DIAGNOSTIC DATA:   1.  The patient's pH 7.4, pCO2 of 68 and pO2 of 111, bicarbonate 42.1. WBC 12.3, hemoglobin 11.3, hematocrit 34.8, platelets 349,000, Patient has sodium 134, potassium 3.9, chloride 92, bicarbonate 35, BUN 20, creatinine 0.6, glucose 82.  2.  UA is leukocyte esterase, bacteria 3+, and WBC 40.  3.  Chest x-ray shows lingular infiltrate.   ASSESSMENT AND PLAN:   1.  This patient is a 69 year old female patient with altered mental status likely secondary  to urinary tract infection and also hypercapnic respiratory failure. The patient is admitted to medicine. Start her on IV Rocephin and Zithromax. Follow urine cultures for urinary tract infection.  2.  Hypercapnic respiratory failure  likely due chronic obstructive pulmonary disease exacerbation.  Continue IV Solu-Medrol, nebulizers and antibiotics and follow clinical course.    3.  Patient has acute on chronic respiratory failure  due to chronic obstructive pulmonary disease exacerbation and pneumonia. Continue oxygen, nebulizers, and steroids. The patient is followed with hospice at home. We will consult palliative care for treatment goals and see if she needs any placement.  4.  The patient this is do not resuscitate and do not intubate. She has " MOST "' form and she wanted to be transferred to hospital if needed, so we spoke with Jeannie from hospice and we are admitting her for  chronic obstructive pulmonary disease and pneumonia. At this time she is a do not resuscitate and do not intubate.  5.  The patient's other diagnoses include hypertension, heart rate and blood pressure are stable, hold the Lasix at this time; continue Cardizem. Sign out to Dr. Leotis ShamesJasmine Singh. I tried to call the patient's sister Marylu LundJanet ,went to voice mail. [facility] works at [employer], we will try to call her again and discuss the goals of treatment.   TIME SPENT:  Was 55 minutes.    ____________________________ Katha HammingSnehalatha Shaquita Fort, MD sk:nt D: 03/16/2014 15:42:37 ET T: 03/16/2014 16:11:13 ET JOB#: [id number]  cc: Katha HammingSnehalatha Rivky Clendenning, MD, <Dictator> Katha HammingSNEHALATHA Marcayla Budge MD ELECTRONICALLY SIGNED 03/25/2014 7:35

## 2014-08-08 NOTE — Discharge Summary (Signed)
Dates of Admission and Diagnosis:  Date of Admission 16-Mar-2014   Date of Discharge 19-Mar-2014   Admitting Diagnosis Acute hypercapnic respiratory failure, COPD exacerbation, UTI   Final Diagnosis Acute hypercapnic respiratory failure, COPD exacerbation, UTI   Discharge Diagnosis 1 Acute hypercapnic respiratory failure, COPD exacerbation, UTI   2 Hypertension   3 Diabetes    Chief Complaint/History of Present Illness 69 year old female with h/o COPD on home oxygen presented to the ED with altered mental status. Patient was found to be in acute hypercapnic respiratory failure, secondary to acute exacebation of COPD exacerbation. + lingular infiltrate was noted on CXR. Urinalysis was consistent with UTI. Patient is DNR and DNI. She is a Hospice patient.   Allergies:  No Known Allergies:     Routine Micro:  30-Nov-15 11:14   Micro Text Report BLOOD CULTURE   COMMENT                   NO GROWTH IN 48 HOURS   ANTIBIOTIC                       Culture Comment NO GROWTH IN 48 HOURS  Result(s) reported on 18 Mar 2014 at 12:00PM.    13:25   Micro Text Report BLOOD CULTURE   COMMENT                   NO GROWTH IN 48 HOURS   ANTIBIOTIC                       Culture Comment NO GROWTH IN 48 HOURS  Result(s) reported on 18 Mar 2014 at 01:01PM.  Lab:  30-Nov-15 12:30   pH (ABG) 7.400 (7.350-7.450 NOTE: New Reference Range 11/08/13)  PCO2  68 (32-48 NOTE: New Reference Range 11/25/13)  PO2  111 (83-108 NOTE: New Reference Range 11/08/13)  FiO2 32  Base Excess  14 (-3-3 NOTE: New Reference Range 11/25/13)  HCO3  42.1 (21.0-28.0 NOTE: New Reference Range 11/08/13)  O2 Saturation 98.3  O2 Device nasal cannula  Specimen Site (ABG) RT RADIAL  Specimen Type (ABG) ARTERIAL (Result(s) reported on 16 Mar 2014 at 12:39PM.)  Lactic Acid, Arterial, Cardiopulmonary 0.7 (0.3-0.8 NOTE: New Reference Range 11/25/13)  Routine Chem:  30-Nov-15 11:58   Glucose, Serum 82  BUN  20   Creatinine (comp) 0.60  Sodium, Serum  134  Potassium, Serum 3.9  Chloride, Serum  92  CO2, Serum  35  Calcium (Total), Serum 8.7  Anion Gap 7  Osmolality (calc) 270  eGFR (African American) >60  eGFR (Non-African American) >60 (eGFR values <18m/min/1.73 m2 may be an indication of chronic kidney disease (CKD). Calculated eGFR, using the MRDR Study equation, is useful in  patients with stable renal function. The eGFR calculation will not be reliable in acutely ill patients when serum creatinine is changing rapidly. It is not useful in patients on dialysis. The eGFR calculation may not be applicable to patients at the low and high extremes of body sizes, pregnant women, and vegetarians.)  Cardiac:  30-Nov-15 11:58   Troponin I < 0.02 (0.00-0.05 0.05 ng/mL or less: NEGATIVE  Repeat testing in 3-6 hrs  if clinically indicated. >0.05 ng/mL: POTENTIAL  MYOCARDIAL INJURY. Repeat  testing in 3-6 hrs if  clinically indicated. NOTE: An increase or decrease  of 30% or more on serial  testing suggests a  clinically important change)  Routine UA:  30-Nov-15 13:34   Color (UA)  Yellow  Clarity (UA) Hazy  Glucose (UA) Negative  Bilirubin (UA) Negative  Ketones (UA) 1+  Specific Gravity (UA) 1.018  Blood (UA) Negative  pH (UA) 6.0  Protein (UA) Negative  Nitrite (UA) Positive  Leukocyte Esterase (UA) 2+ (Result(s) reported on 16 Mar 2014 at 02:23PM.)  RBC (UA) 2 /HPF  WBC (UA) 40 /HPF  Bacteria (UA) 3+  Epithelial Cells (UA) NONE SEEN  Mucous (UA) PRESENT (Result(s) reported on 16 Mar 2014 at 02:23PM.)  WBC Clump (UA) PRESENT  Routine Hem:  30-Nov-15 11:58   WBC (CBC)  12.2  RBC (CBC) 3.89  Hemoglobin (CBC)  11.2  Hematocrit (CBC)  34.8  Platelet Count (CBC) 349 (Result(s) reported on 16 Mar 2014 at 12:40PM.)  MCV 89  MCH 28.9  MCHC 32.3  RDW  16.7   PERTINENT RADIOLOGY STUDIES: XRay:    30-Nov-15 13:51, Chest Portable Single View  Chest Portable Single View    REASON FOR EXAM:    confusion, cough, wheezing  COMMENTS:       PROCEDURE: DXR - DXR PORTABLE CHEST SINGLE VIEW  - Mar 16 2014  1:51PM     CLINICAL DATA:  Confusion, cough and wheezing.    EXAM:  PORTABLE CHEST - 1 VIEW    COMPARISON:  03/05/2014.    FINDINGS:  The right ventricular pacer wires stable. Spinal cord stimulators  are again noted. The cardiac silhouette, mediastinal and hilar  contours are unchanged. Mild stable cardiac enlargement. Vague  density in the left lower lobe partially obscuring the left heart  border suspicious for a lingular infiltrate.     IMPRESSION:  Findings suspicious for a lingular infiltrate.      Electronically Signed    By: Kalman Jewels M.D.    On: 03/16/2014 14:23         Verified By: Marcy Salvo.,   Pertinent Past History:  Pertinent Past History COPD on home oxygen Type 2 diabetes Hypertension   Hospital Course:  Hospital Course 68 year old female with h/o COPD on home oxygen admitted with altered mental status, secondary to acute hypercapnic respiratory failure, COPD exacerbation, + lingular infiltrate on CXR. Patient received supplemental oxygen, IV Solu-Medrol, nebulizer treatments, IV Rocephin and Zithromax. Urinalysis was consistent with UTI. Patient did not c/o burning with urination or abdominal pain. Hypertension and diabetes remained well controlled during her stay. Patient's mental status and respiratory status improved gradually with treatment. She was weaned to nasal canula at 3L/min. Patient walked with her portable oxygen. She feels back to her baseline and wants to return home with Hospice. This was discussed with case management. Exam: Alert, Oriented, NAD, Chest: bilateral scattered rhonchi and coarse crackles, CVS: RRR, Abdo: benign, nontender, Ext: no edema. Plan: Hospice will provide daily services after discharge. Patient has agreed to hospice volunteer services.   Condition on Discharge Guarded    DISCHARGE INSTRUCTIONS HOME MEDS:  Medication Reconciliation: Patient's Home Medications at Discharge:     Medication Instructions  diltiazem 300 mg/24 hours oral capsule, extended release  1 cap(s) orally once a day   theophylline 200 mg oral tablet, extended release  1 tab(s) orally every 12 hours   metoprolol succinate 50 mg oral tablet, extended release  1 tab(s) orally 2 times a day   metformin 1000 mg oral tablet  0.5 tab(s) orally 3 times a day   lorazepam 1 mg oral tablet  0.5 tab(s) orally every 6 hours, As Needed   atorvastatin 40 mg oral tablet  1  tab(s) orally once a day (at bedtime)   prednisone 10 mg oral tablet  1 tab(s) orally once a day   gabapentin 300 mg oral capsule  1 cap(s) orally 4 times a day   klor-con 10 meq oral tablet, extended release  1 tab(s) orally once a day   lasix 40 mg oral tablet  1 tab(s) orally 2 times a day   omeprazole 20 mg oral delayed release capsule  1 cap(s) orally 2 times a day   isosorbide mononitrate extended release 30 mg oral tablet, extended release  1 tab(s) orally once a day (in the morning)   aspirin 325 mg oral delayed release tablet  1 tab(s) orally once a day   brovana 15 mcg/2 ml inhalation solution  2 milliliter(s) inhaled 2 times a day   trazodone 50 mg oral tablet  1  orally once a day (at bedtime)   budesonide 0.25 mg/2 ml inhalation suspension  2 milliliter(s) inhaled 2 times a day   voltaren topical 1% topical gel  Apply topically to affected area , As Needed   caltrate 600 + d  1 tab(s) orally once a day   ventolin hfa 90 mcg/inh inhalation aerosol  2 puff(s) inhaled every 6 hours   albuterol 2.5 mg/3 ml (0.083%) inhalation solution  3 milliliter(s) inhaled every 6 hours, As Needed   betamethasone-clotrimazole topical 0.05%-1% topical cream  Apply topically to affected area 2 times a day   acetaminophen-oxycodone 325 mg-5 mg oral tablet  1-2 tab(s) orally every 6 hours, As Needed   tiotropium 2.5 mcg/inh inhalation  aerosol  2 puff(s) inhaled once a day   dymista 137 mcg-50 mcg/inh nasal spray  2 spray(s) nasal 2 times a day   magnesium oxide 400 mg (241.3 mg elemental magnesium) oral tablet  1 tab(s) orally once a day noon   vitamin c 1000 mg oral tablet  1 tab(s) orally once a day   promethazine 25 mg oral tablet  1 tab(s) orally every 6 hours, As Needed   dulcolax laxative 5 mg oral delayed release tablet  1-2 tab(s) orally once a day   reglan 10 mg oral tablet  1 tab(s) orally 4 times a day (before meals and at bedtime)   senokot  1 tab(s) orally once a day   oxycontin 10 mg oral tablet, extended release  1 tab(s) orally every 12 hours   cephalexin 500 mg oral capsule  1 cap(s) orally 4 times a day   azithromycin 250 mg oral tablet  1 tab(s) orally     PRESCRIPTIONS: ELECTRONICALLY SUBMITTED   Physician's Instructions:  Diet Low Sodium  Carbohydrate Controlled (ADA) Diet   Activity Limitations As tolerated   Return to Work Not Applicable   Time frame for Follow Up Appointment 1-2 weeks     Candiss Norse, Laurence Folz(Attending Physician): [facility], [address] [place], [place] [address], [place] [phone]  Electronic Signatures: Glendon Axe (MD)  (Signed 03-Dec-15 13:47)  Authored: ADMISSION DATE AND DIAGNOSIS, CHIEF COMPLAINT/HPI, Allergies, PERTINENT LABS, PERTINENT RADIOLOGY STUDIES, PERTINENT PAST HISTORY, HOSPITAL COURSE, DISCHARGE INSTRUCTIONS HOME MEDS, PATIENT INSTRUCTIONS, Follow Up Physician   Last Updated: 03-Dec-15 13:47 by Glendon Axe (MD)

## 2014-08-08 NOTE — Discharge Summary (Signed)
PATIENT NAME:  Sydney MaclachlanDUNCAN, Dameshia M MR#:  [id number] DATE OF BIRTH:  02-13-1946  DATE OF ADMISSION:  01/07/2014 DATE OF DISCHARGE:  01/11/2014  DISCHARGE DIAGNOSES:  1. Chronic obstructive pulmonary disease exacerbation.  2. Olecranon bursitis.  3. Hypertension.  4. Diabetes.   HISTORY OF PRESENT ILLNESS: Please see initial history and physical for details. Briefly, a 69 year old female with history of advanced COPD on home O2, as well as obstructive sleep apnea on CPAP, admitted with lethargy and altered mental status. She was found to have a COPD exacerbation and was started on BiPAP. She was treated with IV Solu-Medrol, DuoNebs, and IV levofloxacin as well as theophylline and Spiriva. Her symptoms improved. Her chest x-ray was negative for pneumonia. The patient will be discharged on a prednisone taper as well as resume her outpatient COPD regimen. She was also discharged on doxycycline. For her diabetes, this remained well controlled. She has been treated for her olecranon bursitis with Augmentin and doxycycline.   DISCHARGE MEDICATIONS: Please see [facility] discharge instructions.  DISCHARGE FOLLOWUP: The patient will follow up with Dr. Leotis ShamesJasmine Singh within 1 to 2 weeks.  DISCHARGE OXYGEN: 3 liters home O2.   HOME HEALTH: Patient will receive home health with physical therapy and nursing.   TIME SPENT: This discharge took 35 minutes.    ____________________________ Stann Mainlandavid P. Sampson GoonFitzgerald, MD dpf:jh D: 02/06/2014 13:21:30 ET T: 02/06/2014 14:31:45 ET JOB#: [id number]  cc: Stann Mainlandavid P. Sampson GoonFitzgerald, MD, <Dictator> Toniette Devera Sampson GoonFITZGERALD MD ELECTRONICALLY SIGNED 02/10/2014 9:29

## 2015-01-04 IMAGING — US US CAROTID DUPLEX BILAT
1 series · 14 of 24 positions shown · non-contrast
Comparison: none

REASON FOR EXAM: Syncope
COMMENTS:

[Series 1: us carotid duplex bilat · 0.07mm/px · 14 of 63 slices shown]
[im 1/63]
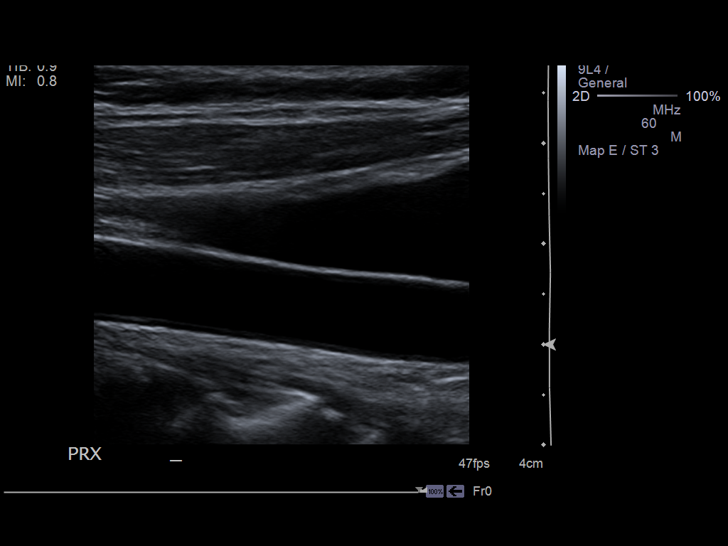
[im 6/63]
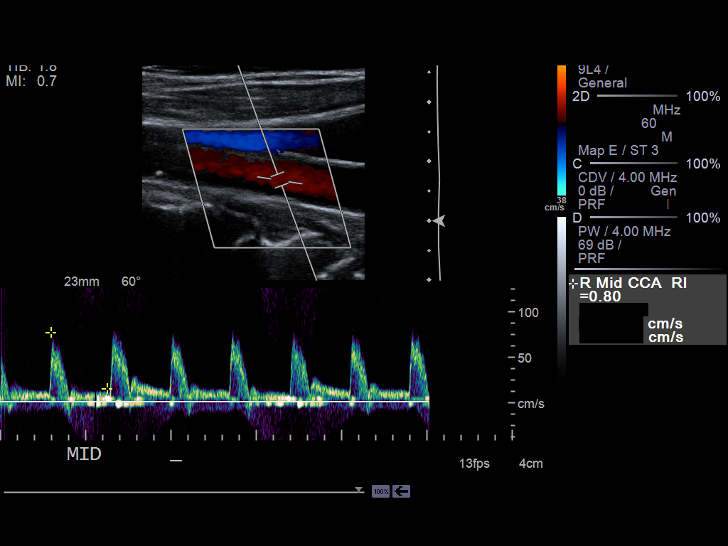
[im 11/63]
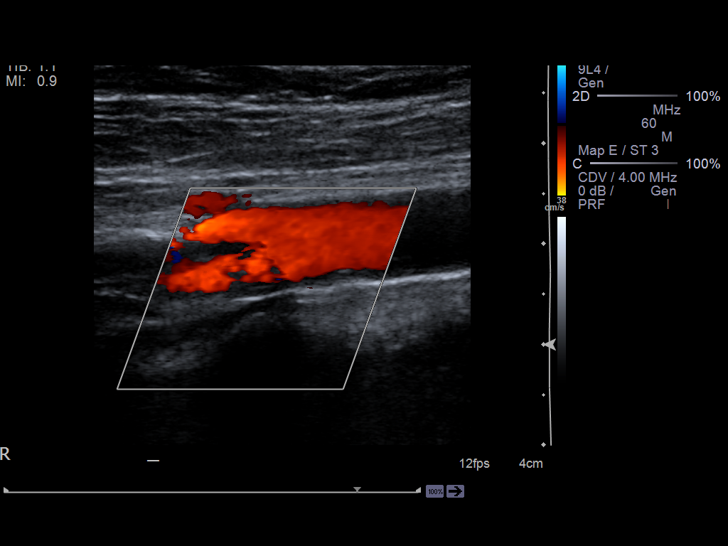
[im 17/63]
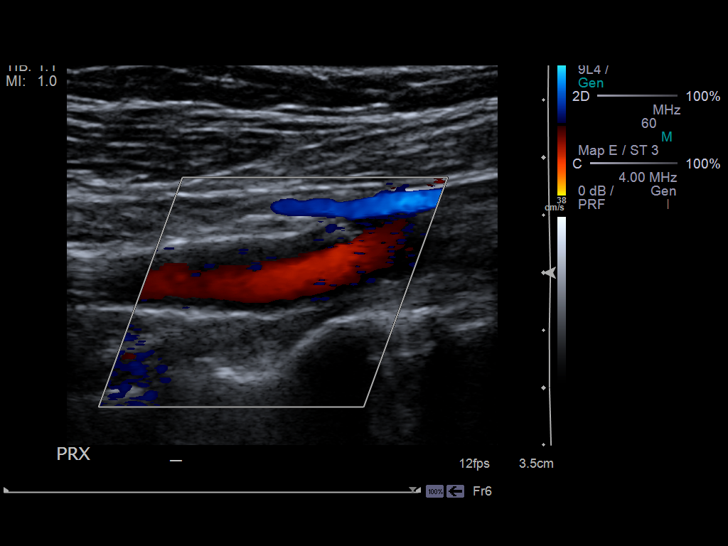
[im 19/63]
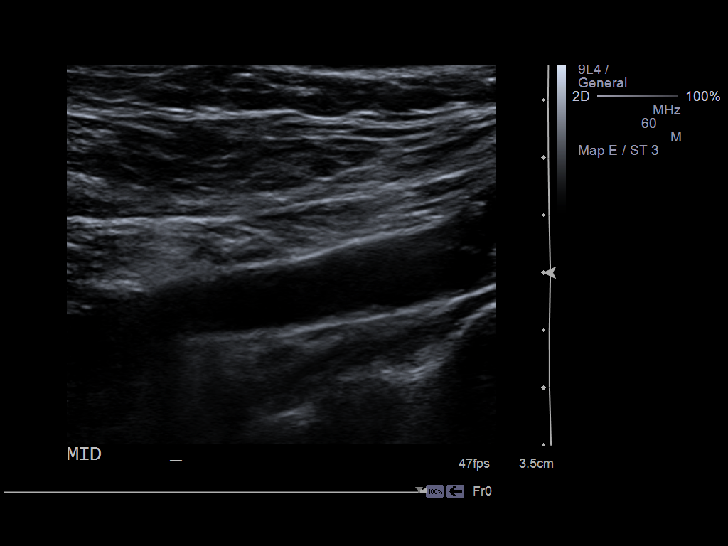
[im 25/63]
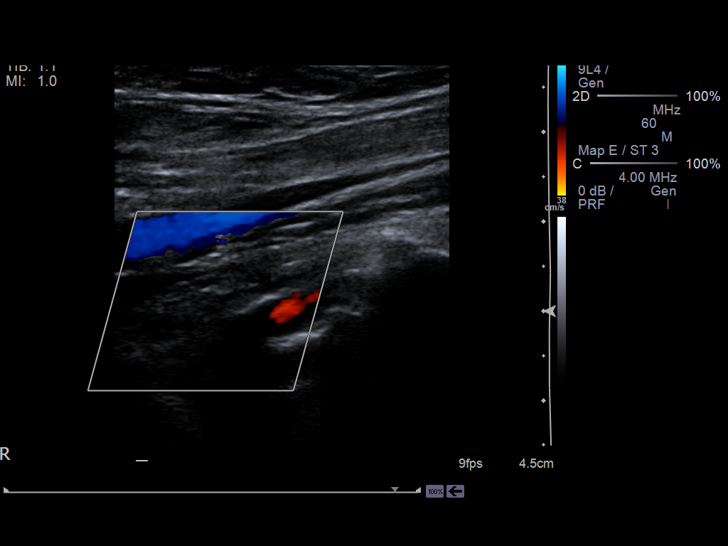
[im 30/63]
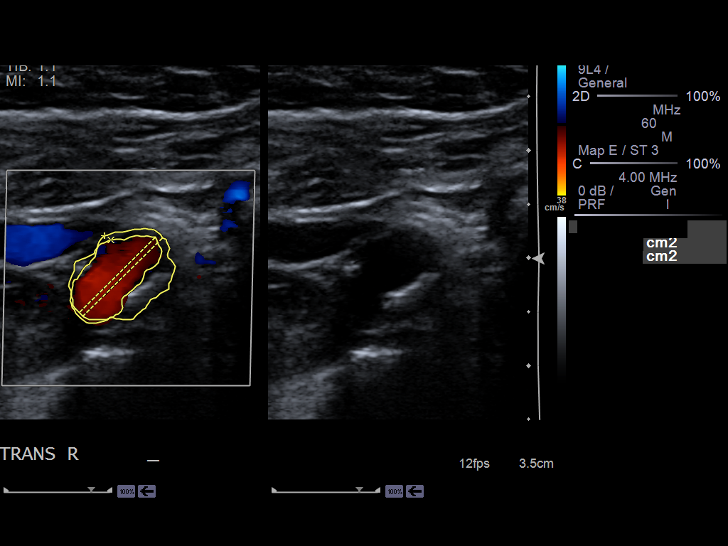
[im 33/63]
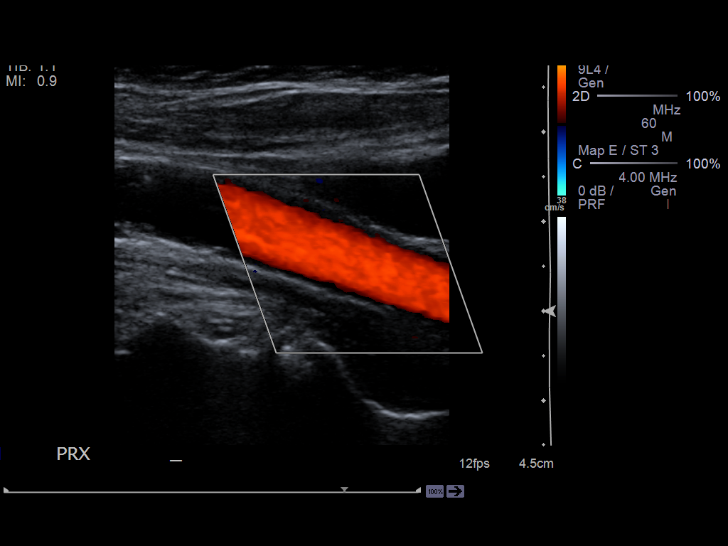
[im 38/63]
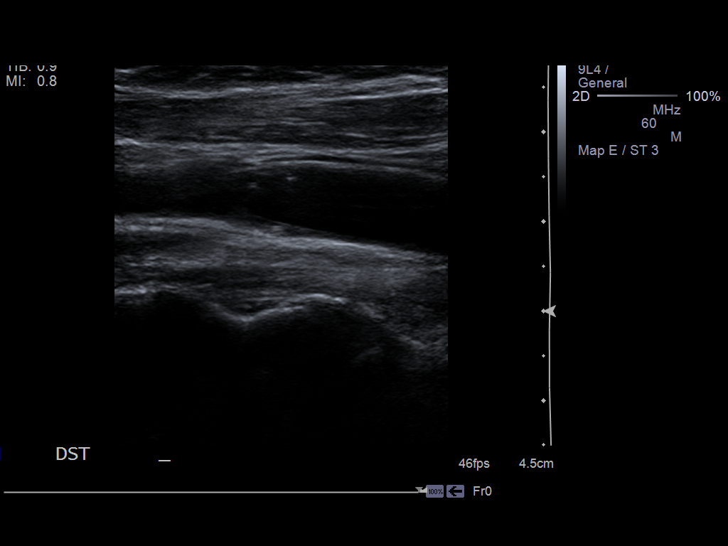
[im 44/63]
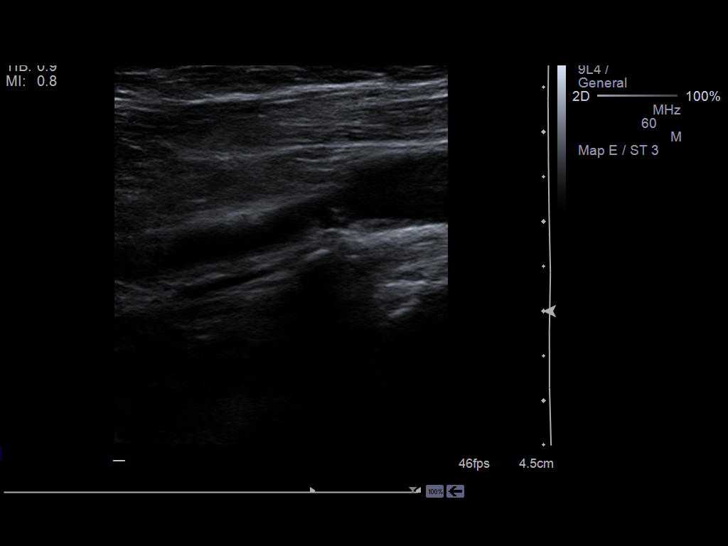
[im 49/63]
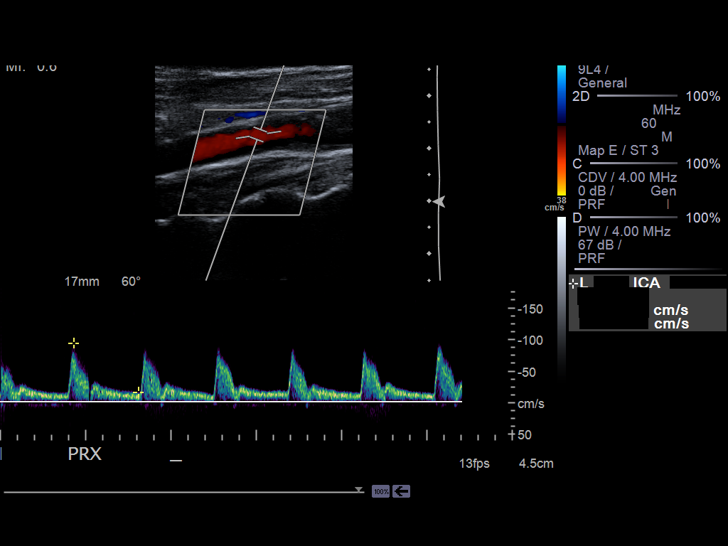
[im 52/63]
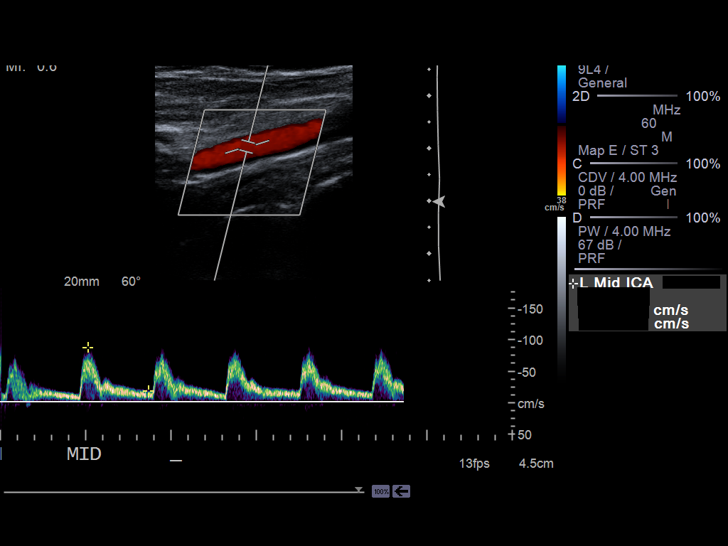
[im 57/63]
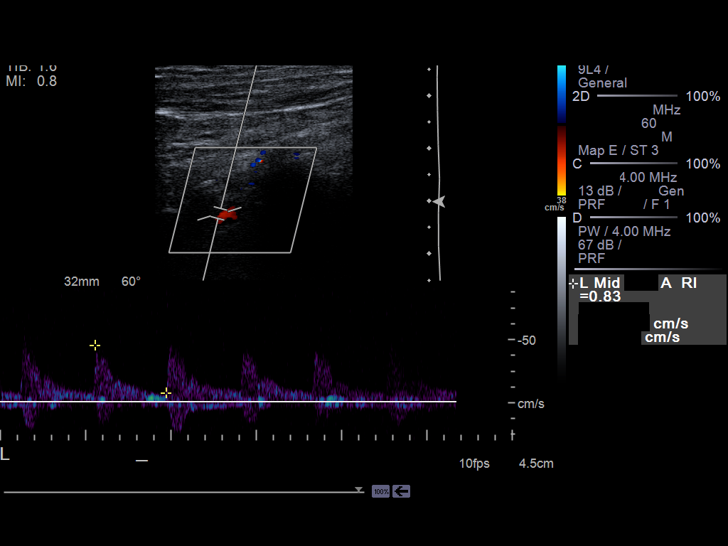
[im 63/63]
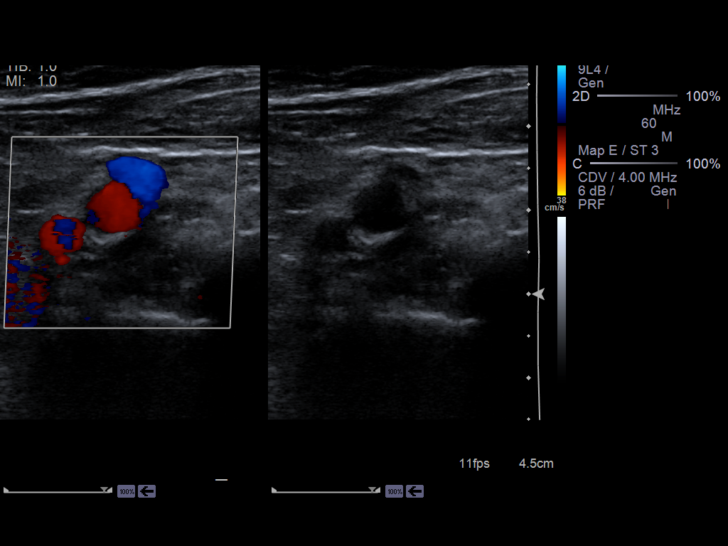

[14 of 24 positions shown; findings below may reference images not displayed]

PROCEDURE:     JUNYOR - JUNYOR CAROTID DOPPLER BILATERAL  - June 12, 2012  [DATE]

RESULT:     Doppler evaluation the carotids shows moderate amount of plaque
in the right internal carotid and carotid bulb and mild minimal left
internal carotid without visual evidence of severe stenosis or turbulence.
There is antegrade flow in the vertebral arteries without flow reversal. The
color and spectral Doppler appearance is normal. The peak systolic
velocities appear to be normal. The internal to common carotid peak systolic
velocity ratio is 1.22 on the right and 1.10 on the left.
IMPRESSION: 1. Atherosclerotic disease without evidence of hemodynamically significant
stenosis.

[REDACTED]

## 2015-02-12 IMAGING — CT CT CHEST W/ CM
1 series · 15 of 32 positions shown, 19 images · IV contrast (APPLIED)
Comparison: 03/10/2011

REASON FOR EXAM: HYPOXIA, DECREASED MENTAL STATUS
COMMENTS:   May transport without cardiac monitor

PROCEDURE:     CT  - CT CHEST (FOR PE) W  - July 21, 2012 [DATE]
RESULT:     Indications: Chest Pain
TECHNIQUE: A thin-section spiral CT from the lung apices to the upper
abdomen was acquired on a multi slice scanner following 100ml [id number]
intravenous contrast. These images were then transferred to the [employer] work
station and were subsequently reviewed utilizing 3-D reconstructions and MIP
images.

[Series 4: soft tissue · axial · 0.82mm/px · z∈[-321,-39]mm · 15 of 106 slices shown, 19 images]
[im 8/106  mediastinal]
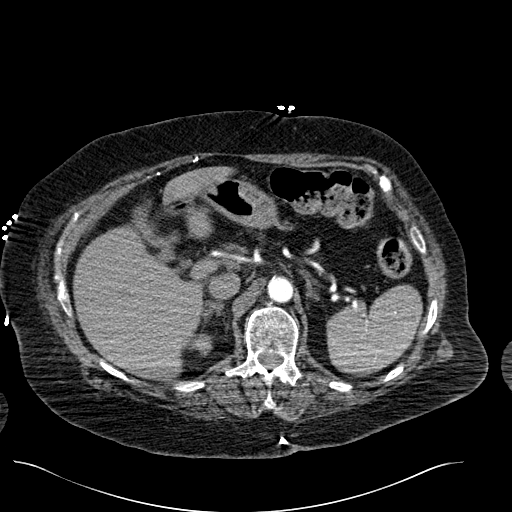
[im 8/106  lung]
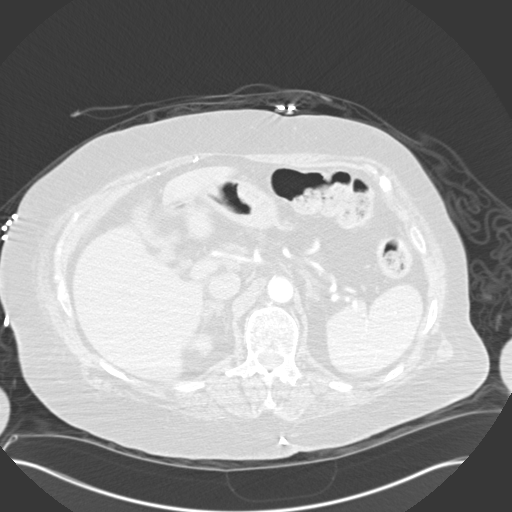
[im 16/106  lung]
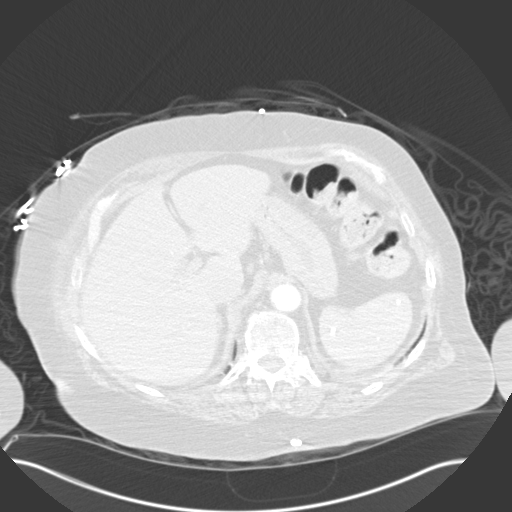
[im 22/106  lung]
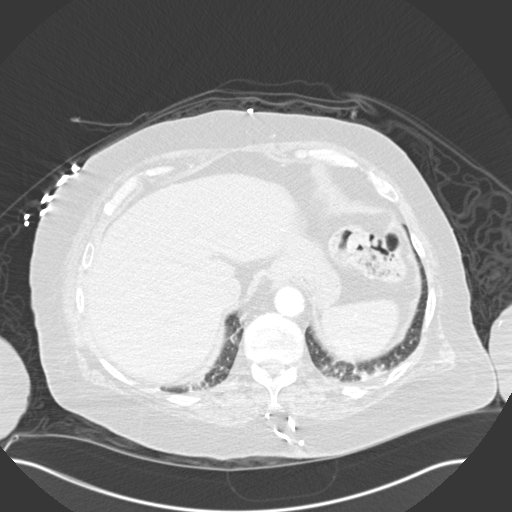
[im 28/106  lung]
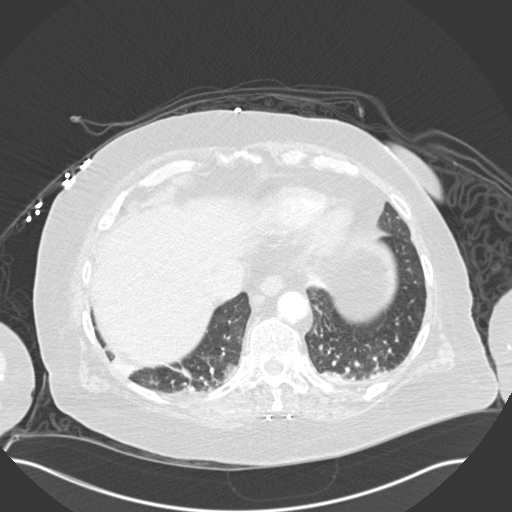
[im 36/106  mediastinal]
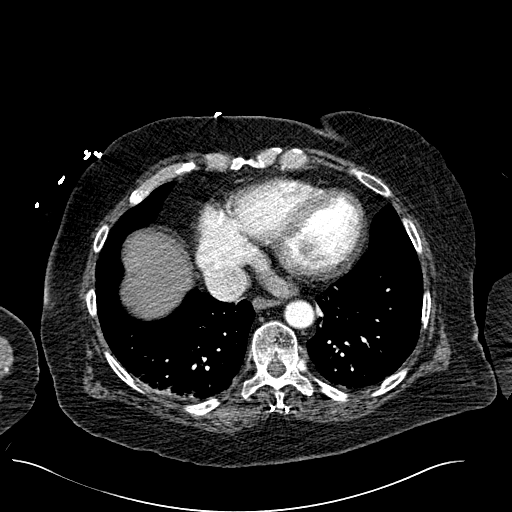
[im 36/106  lung]
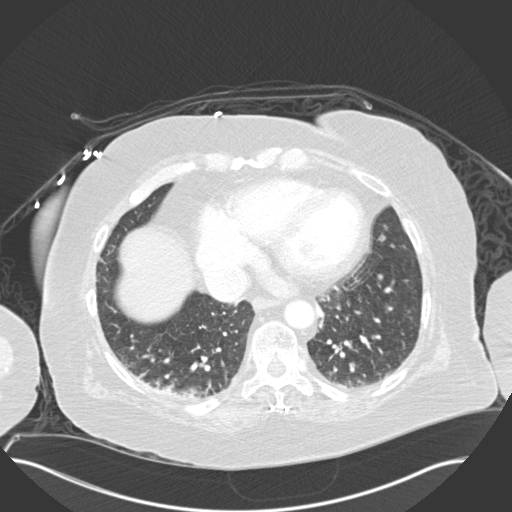
[im 43/106  lung]
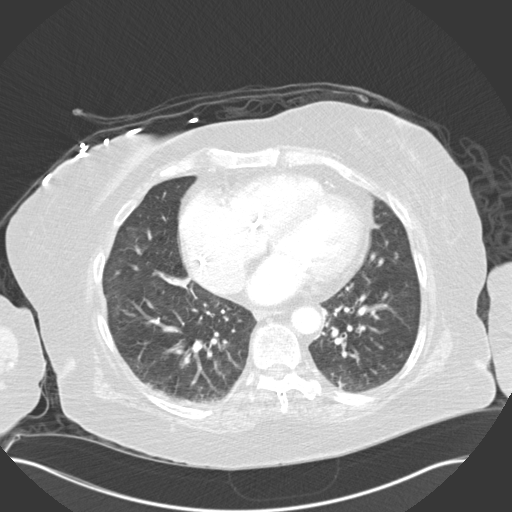
[im 51/106  lung]
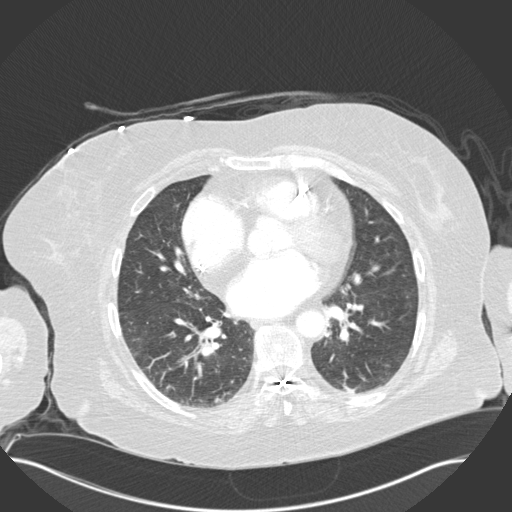
[im 56/106  lung]
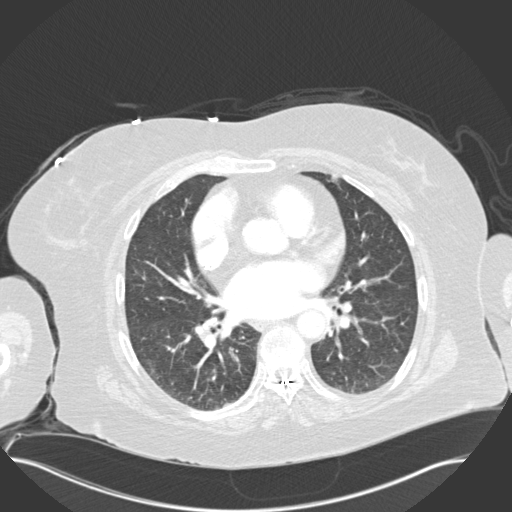
[im 63/106  mediastinal]
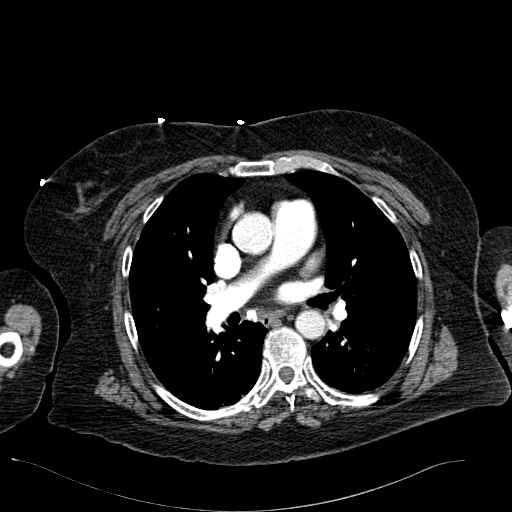
[im 63/106  lung]
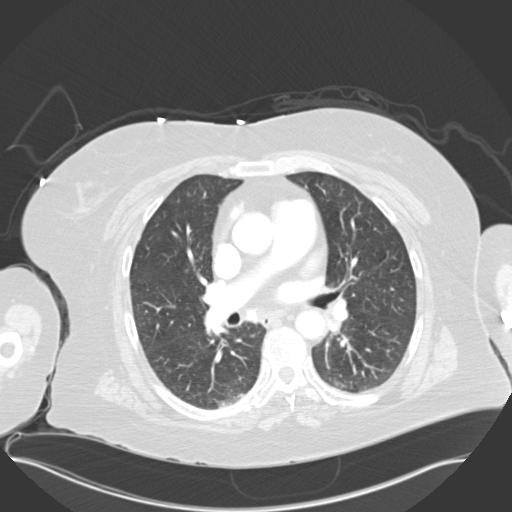
[im 67/106  lung]
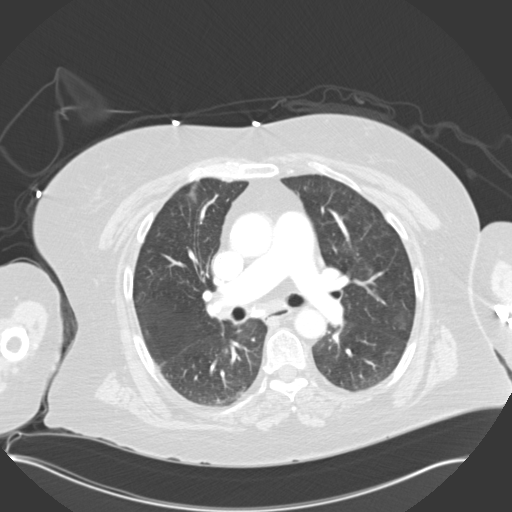
[im 74/106  lung]
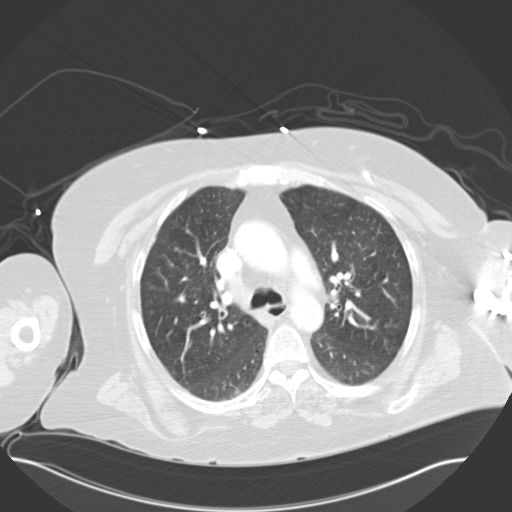
[im 82/106  lung]
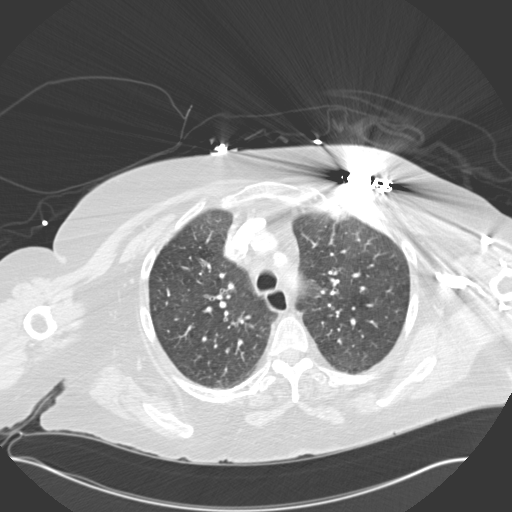
[im 86/106  mediastinal]
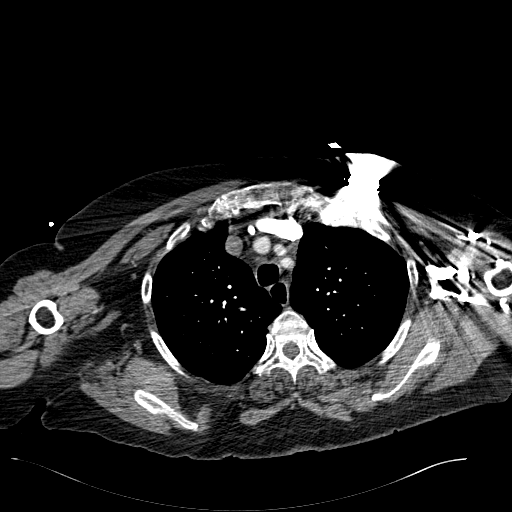
[im 86/106  lung]
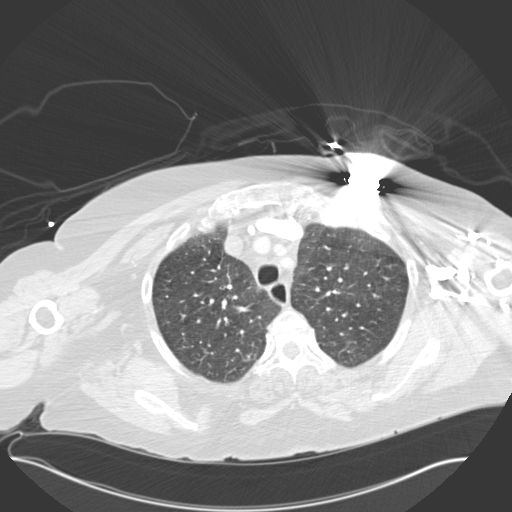
[im 94/106  lung]
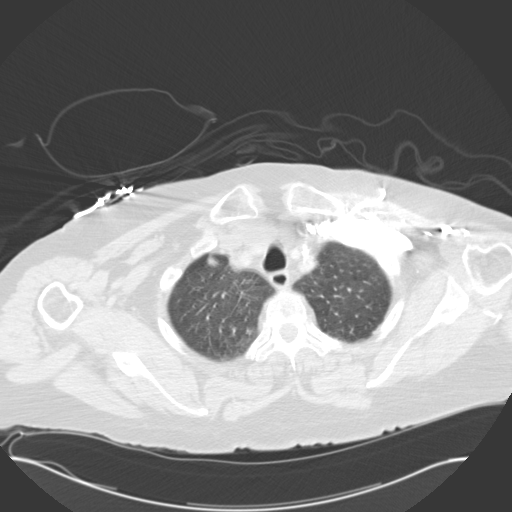
[im 102/106  lung]
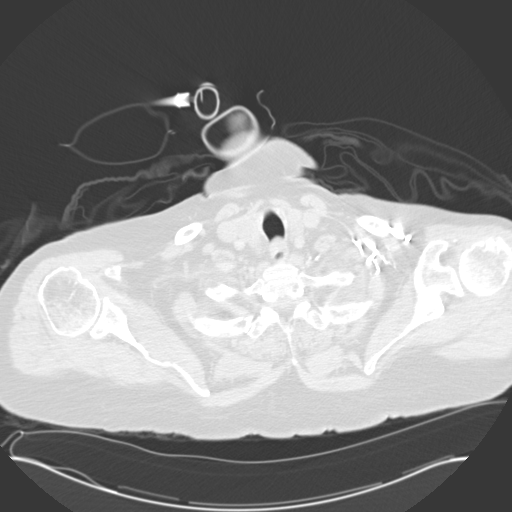

[15 of 32 positions shown; findings below may reference images not displayed]

FINDINGS: There is adequate opacification of the pulmonary arteries. There is no
pulmonary embolus. The main pulmonary artery, right main pulmonary artery,
and left main pulmonary arteries are normal in size. The heart size is
normal. There is no pericardial effusion. There is a cardiac pacer noted.

There is bibasilar atelectasis. There is no focal consolidation, pleural
effusion, or pneumothorax.

There is no axillary, hilar, or mediastinal adenopathy.

The osseous structures are unremarkable.

The visualized portions of the upper abdomen are unremarkable.

There is prominence of the inferior pole of the right thyroid. In
IMPRESSION: 1. No CT evidence of pulmonary embolus.

2. There is prominence of the inferior pole of the right thyroid gland.
Recommend further evaluation with ultrasound.

[REDACTED]

## 2015-02-14 IMAGING — CR DG CHEST 1V PORT
1 series · 1 of 1 positions shown · non-contrast
Comparison: none

REASON FOR EXAM: SOB sats dropping
COMMENTS:

PROCEDURE:     DXR - DXR PORTABLE CHEST SINGLE VIEW  - July 23, 2012  [DATE]
RESULT:     Comparison: 07/21/2012

[ap]
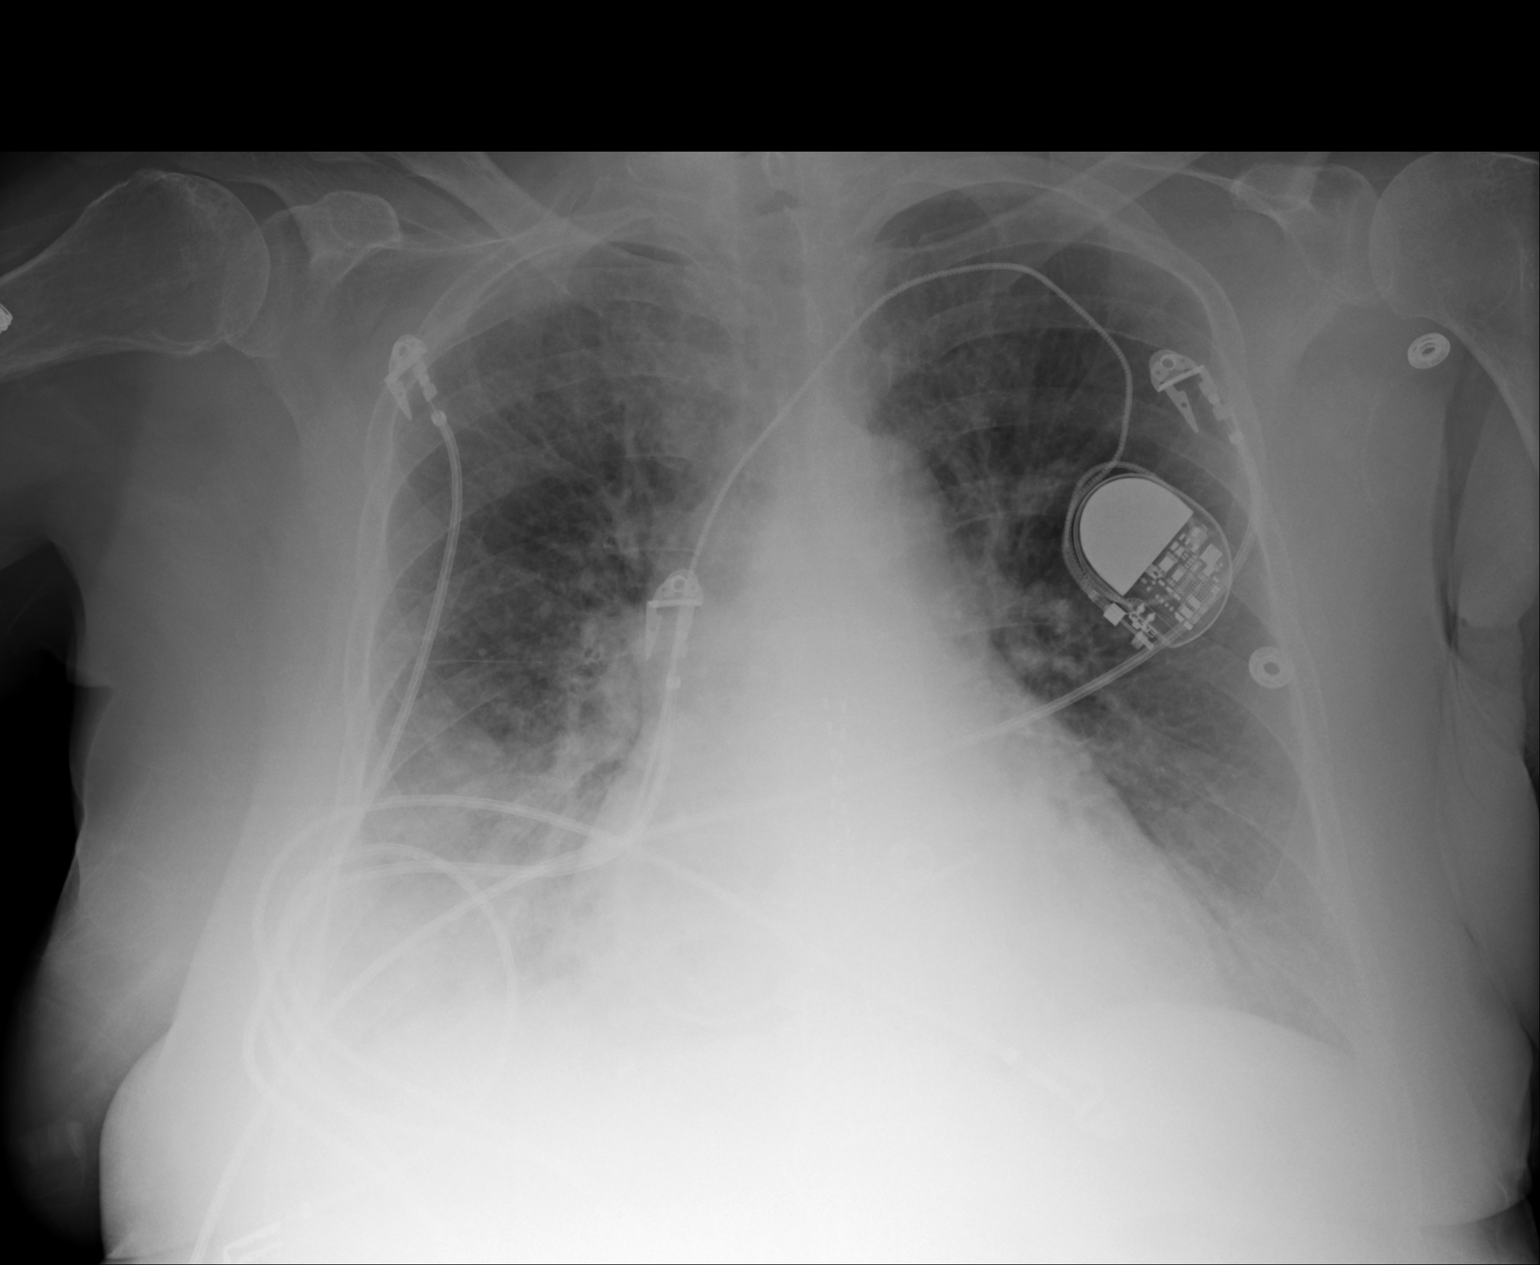

[1 of 1 positions shown; findings below may reference images not displayed]

FINDINGS: Cardiomegaly and the mediastinum are similar to prior. A wire is seen from
single-lead pacemaker. There are heterogeneous opacities in the mid and
lower right lung which are new from prior. There is suggestion of mild
interstitial opacities which may be secondary to interstitial edema. Linear
metallic densities overlying the thoracic spine may represent a spinal
stimulator.
IMPRESSION: 1. New heterogeneous opacities in the mid and lower right lung are
concerning for infection or aspiration. Asymmetric pulmonary edema would be
of differential consideration.
2. Mild interstitial opacities may represent mild interstitial edema.

[REDACTED]
